# Patient Record
Sex: Male | Born: 1950
Health system: Southern US, Community
[De-identification: ages and names within clinical notes are randomized; demographics above are authoritative.]

## PROBLEM LIST (undated history)

## (undated) DIAGNOSIS — Z96642 Presence of left artificial hip joint: Secondary | ICD-10-CM

## (undated) DIAGNOSIS — T84061A Wear of articular bearing surface of internal prosthetic left hip joint, initial encounter: Secondary | ICD-10-CM

## (undated) DIAGNOSIS — L639 Alopecia areata, unspecified: Secondary | ICD-10-CM

## (undated) DIAGNOSIS — Z8711 Personal history of peptic ulcer disease: Secondary | ICD-10-CM

## (undated) DIAGNOSIS — E119 Type 2 diabetes mellitus without complications: Secondary | ICD-10-CM

## (undated) DIAGNOSIS — M160 Bilateral primary osteoarthritis of hip: Secondary | ICD-10-CM

## (undated) DIAGNOSIS — T4145XA Adverse effect of unspecified anesthetic, initial encounter: Secondary | ICD-10-CM

## (undated) DIAGNOSIS — J189 Pneumonia, unspecified organism: Secondary | ICD-10-CM

## (undated) DIAGNOSIS — T8859XA Other complications of anesthesia, initial encounter: Secondary | ICD-10-CM

## (undated) DIAGNOSIS — E7800 Pure hypercholesterolemia, unspecified: Secondary | ICD-10-CM

## (undated) DIAGNOSIS — K219 Gastro-esophageal reflux disease without esophagitis: Secondary | ICD-10-CM

## (undated) DIAGNOSIS — R490 Dysphonia: Secondary | ICD-10-CM

## (undated) DIAGNOSIS — K2270 Barrett's esophagus without dysplasia: Secondary | ICD-10-CM

## (undated) DIAGNOSIS — M1990 Unspecified osteoarthritis, unspecified site: Secondary | ICD-10-CM

## (undated) DIAGNOSIS — Z87442 Personal history of urinary calculi: Secondary | ICD-10-CM

## (undated) DIAGNOSIS — Z96641 Presence of right artificial hip joint: Secondary | ICD-10-CM

## (undated) DIAGNOSIS — Z8719 Personal history of other diseases of the digestive system: Secondary | ICD-10-CM

## (undated) DIAGNOSIS — K759 Inflammatory liver disease, unspecified: Secondary | ICD-10-CM

## (undated) HISTORY — PX: HEMIARTHROPLASTY (BIPOLAR), HIP, DIRECT ANTERIOR APPROACH, FOR FRACTURE: SHX7584

## (undated) HISTORY — PX: SHOULDER SURGERY: SHX246

## (undated) HISTORY — PX: BACK SURGERY: SHX140

## (undated) HISTORY — PX: HERNIA REPAIR: SHX51

## (undated) HISTORY — DX: Type 2 diabetes mellitus without complications: E11.9

---
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
## 1981-08-28 HISTORY — PX: SPLENECTOMY: SUR1306

## 2001-08-28 HISTORY — PX: JOINT REPLACEMENT: SHX530

## 2001-08-28 HISTORY — PX: ARTHROPLASTY: SHX135

## 2005-03-02 ENCOUNTER — Ambulatory Visit: Payer: Self-pay | Admitting: Internal Medicine

## 2005-10-21 ENCOUNTER — Emergency Department: Payer: Self-pay | Admitting: Emergency Medicine

## 2005-10-25 ENCOUNTER — Ambulatory Visit: Payer: Self-pay | Admitting: Pain Medicine

## 2005-11-02 ENCOUNTER — Ambulatory Visit: Payer: Self-pay | Admitting: Pain Medicine

## 2005-11-20 ENCOUNTER — Ambulatory Visit: Payer: Self-pay | Admitting: Pain Medicine

## 2006-02-27 ENCOUNTER — Ambulatory Visit: Payer: Self-pay | Admitting: Cardiovascular Disease

## 2006-03-03 ENCOUNTER — Emergency Department: Payer: Self-pay | Admitting: Emergency Medicine

## 2008-01-16 ENCOUNTER — Emergency Department: Payer: Self-pay | Admitting: Emergency Medicine

## 2008-04-09 ENCOUNTER — Other Ambulatory Visit: Payer: Self-pay

## 2008-04-10 ENCOUNTER — Inpatient Hospital Stay: Payer: Self-pay | Admitting: Internal Medicine

## 2008-06-30 ENCOUNTER — Ambulatory Visit: Payer: Self-pay | Admitting: Unknown Physician Specialty

## 2008-10-27 ENCOUNTER — Ambulatory Visit: Payer: Self-pay

## 2008-11-11 ENCOUNTER — Ambulatory Visit: Payer: Self-pay | Admitting: Pain Medicine

## 2008-11-17 ENCOUNTER — Ambulatory Visit: Payer: Self-pay | Admitting: Pain Medicine

## 2008-12-01 ENCOUNTER — Ambulatory Visit: Payer: Self-pay | Admitting: Physician Assistant

## 2008-12-15 ENCOUNTER — Ambulatory Visit: Payer: Self-pay | Admitting: Pain Medicine

## 2008-12-29 ENCOUNTER — Ambulatory Visit: Payer: Self-pay | Admitting: Pain Medicine

## 2009-01-12 ENCOUNTER — Ambulatory Visit: Payer: Self-pay | Admitting: Physician Assistant

## 2009-04-14 ENCOUNTER — Ambulatory Visit: Payer: Self-pay

## 2009-05-25 ENCOUNTER — Ambulatory Visit: Payer: Self-pay | Admitting: Unknown Physician Specialty

## 2009-06-01 ENCOUNTER — Inpatient Hospital Stay: Payer: Self-pay | Admitting: Unknown Physician Specialty

## 2009-06-23 ENCOUNTER — Ambulatory Visit: Payer: Self-pay | Admitting: Unknown Physician Specialty

## 2009-06-29 ENCOUNTER — Ambulatory Visit: Payer: Self-pay | Admitting: Unknown Physician Specialty

## 2010-01-13 ENCOUNTER — Observation Stay (HOSPITAL_COMMUNITY): Admission: EM | Admit: 2010-01-13 | Discharge: 2010-01-14 | Payer: Self-pay | Admitting: Emergency Medicine

## 2010-02-03 ENCOUNTER — Ambulatory Visit: Payer: Self-pay | Admitting: Unknown Physician Specialty

## 2010-02-10 ENCOUNTER — Inpatient Hospital Stay: Payer: Self-pay | Admitting: Unknown Physician Specialty

## 2010-06-21 ENCOUNTER — Ambulatory Visit: Payer: Self-pay

## 2013-01-15 ENCOUNTER — Ambulatory Visit: Payer: Self-pay | Admitting: Family Medicine

## 2015-03-24 DIAGNOSIS — E785 Hyperlipidemia, unspecified: Secondary | ICD-10-CM | POA: Insufficient documentation

## 2015-03-24 DIAGNOSIS — L639 Alopecia areata, unspecified: Secondary | ICD-10-CM | POA: Insufficient documentation

## 2015-03-24 DIAGNOSIS — Z8711 Personal history of peptic ulcer disease: Secondary | ICD-10-CM | POA: Insufficient documentation

## 2015-03-24 DIAGNOSIS — M1990 Unspecified osteoarthritis, unspecified site: Secondary | ICD-10-CM | POA: Insufficient documentation

## 2015-03-24 DIAGNOSIS — E7800 Pure hypercholesterolemia, unspecified: Secondary | ICD-10-CM | POA: Insufficient documentation

## 2015-03-24 DIAGNOSIS — K30 Functional dyspepsia: Secondary | ICD-10-CM | POA: Insufficient documentation

## 2015-03-26 ENCOUNTER — Ambulatory Visit (INDEPENDENT_AMBULATORY_CARE_PROVIDER_SITE_OTHER): Payer: BLUE CROSS/BLUE SHIELD | Admitting: Family Medicine

## 2015-03-26 ENCOUNTER — Encounter: Payer: Self-pay | Admitting: Family Medicine

## 2015-03-26 VITALS — BP 102/78 | HR 68 | Temp 97.9°F | Resp 16 | Wt 225.8 lb

## 2015-03-26 DIAGNOSIS — R10814 Left lower quadrant abdominal tenderness: Secondary | ICD-10-CM | POA: Diagnosis not present

## 2015-03-26 DIAGNOSIS — E7800 Pure hypercholesterolemia, unspecified: Secondary | ICD-10-CM

## 2015-03-26 LAB — POCT URINALYSIS DIPSTICK
Bilirubin, UA: NEGATIVE
Glucose, UA: NEGATIVE
Ketones, UA: NEGATIVE
LEUKOCYTES UA: NEGATIVE
NITRITE UA: NEGATIVE
PROTEIN UA: NEGATIVE
RBC UA: NEGATIVE
Spec Grav, UA: 1.015
UROBILINOGEN UA: 0.2
pH, UA: 5

## 2015-03-26 NOTE — Progress Notes (Signed)
Patient ID: DELEON PASSE, male   DOB: [birth year], 64 y.o.   MRN: [id number]    Patient: Kevin Mccullough Male    DOB: 17-May-1951   64 y.o.   MRN: [id number] Visit Date: 03/26/2015  Today's Provider: Vernie Murders, PA   Chief Complaint  Patient presents with  . Abdominal Pain    episodic X 2 months   Subjective:    HPI  This 64 year old male developed episodes of LLQ abdominal pain without fever, bloody stools, constipation or diarrhea. No dysuria or hematuria. Feeling well today without discomfort. Scheduled a CPE next week. Has not taken any medications recently. History reviewed. No pertinent past medical history. Patient Active Problem List   Diagnosis Date Noted  . AA (alopecia areata) 03/24/2015  . Acid indigestion 03/24/2015  . H/O peptic ulcer 03/24/2015  . HLD (hyperlipidemia) 03/24/2015  . Arthritis, degenerative 03/24/2015  . Hypercholesterolemia without hypertriglyceridemia 03/24/2015   Family History  Problem Relation Age of Onset  . Liver disease Mother   . Arthritis Sister   . Diabetes Sister   . Multiple sclerosis Daughter   . Arthritis Sister   . Arthritis Sister    No Known Allergies    Previous Medications   CHOLECALCIFEROL (VITAMIN D3) 2000 UNITS TABS    Take 1 tablet by mouth daily.   MULTIPLE VITAMINS-MINERALS PO    Take 1 tablet by mouth daily.   OMEGA-3 FATTY ACIDS PO    Take 1 tablet by mouth daily.    Review of Systems  Constitutional: Negative.   HENT: Negative.   Respiratory: Negative.   Cardiovascular: Negative.   Gastrointestinal: Positive for abdominal pain and abdominal distention.       No pain or distention today.  Genitourinary: Negative.   All other systems reviewed and are negative.  History  Substance Use Topics  . Smoking status: Former Smoker -- 1.00 packs/day for 17 years    Types: Cigarettes  . Smokeless tobacco: Never Used     Comment: QUIT IN 1989  . Alcohol Use: No   Objective:   BP 102/78 mmHg  Pulse 68   Temp(Src) 97.9 F (36.6 C) (Oral)  Resp 16  Wt 225 lb 12.8 oz (102.422 kg)  Physical Exam  Constitutional: He is oriented to person, place, and time. He appears well-developed and well-nourished.  HENT:  Head: Normocephalic and atraumatic.  Eyes: Conjunctivae and EOM are normal.  Cardiovascular: Normal rate and regular rhythm.   Pulmonary/Chest: Effort normal and breath sounds normal.  Abdominal: Soft. Bowel sounds are normal. He exhibits no mass.  Genitourinary: Penis normal.  Neurological: He is alert and oriented to person, place, and time.      Assessment & Plan:   1. LLQ abdominal tenderness Resolved today. Recommend bland diet with increased liquids - low residue. May have been an episode of mild diverticulitis. States he has scheduled a CPE here next Friday and had received a letter from Dr. Vira Agar regarding a need for a colonoscopy. Denies blood in stools, constipation or diarrhea. Will check labs and follow up pending reports. - Comprehensive metabolic panel - CBC with Differential/Platelet - PSA - POCT Urinalysis Dipstick  2. Hypercholesterolemia without hypertriglyceridemia Trying to lose weight. Does not take any medications or other supplements for cholesterol. Encouraged to follow a low fat diet and will recheck labs. - Comprehensive metabolic panel - Lipid panel - TSH

## 2015-03-30 ENCOUNTER — Telehealth: Payer: Self-pay

## 2015-03-30 LAB — CBC WITH DIFFERENTIAL/PLATELET
Basophils Absolute: 0 10*3/uL (ref 0.0–0.2)
Basos: 1 %
EOS (ABSOLUTE): 0.3 10*3/uL (ref 0.0–0.4)
EOS: 4 %
HEMATOCRIT: 41.7 % (ref 37.5–51.0)
Hemoglobin: 14.4 g/dL (ref 12.6–17.7)
Immature Grans (Abs): 0 10*3/uL (ref 0.0–0.1)
Immature Granulocytes: 0 %
LYMPHS ABS: 2.7 10*3/uL (ref 0.7–3.1)
Lymphs: 33 %
MCH: 32.1 pg (ref 26.6–33.0)
MCHC: 34.5 g/dL (ref 31.5–35.7)
MCV: 93 fL (ref 79–97)
MONOCYTES: 7 %
MONOS ABS: 0.6 10*3/uL (ref 0.1–0.9)
NEUTROS ABS: 4.6 10*3/uL (ref 1.4–7.0)
NEUTROS PCT: 55 %
Platelets: 294 10*3/uL (ref 150–379)
RBC: 4.49 x10E6/uL (ref 4.14–5.80)
RDW: 14.2 % (ref 12.3–15.4)
WBC: 8.2 10*3/uL (ref 3.4–10.8)

## 2015-03-30 LAB — TSH: TSH: 3.18 u[IU]/mL (ref 0.450–4.500)

## 2015-03-30 LAB — COMPREHENSIVE METABOLIC PANEL
ALBUMIN: 4.6 g/dL (ref 3.6–4.8)
ALK PHOS: 62 IU/L (ref 39–117)
ALT: 25 IU/L (ref 0–44)
AST: 21 IU/L (ref 0–40)
Albumin/Globulin Ratio: 1.9 (ref 1.1–2.5)
BUN / CREAT RATIO: 9 — AB (ref 10–22)
BUN: 10 mg/dL (ref 8–27)
Bilirubin Total: 1.3 mg/dL — ABNORMAL HIGH (ref 0.0–1.2)
CO2: 23 mmol/L (ref 18–29)
Calcium: 9.9 mg/dL (ref 8.6–10.2)
Chloride: 102 mmol/L (ref 97–108)
Creatinine, Ser: 1.13 mg/dL (ref 0.76–1.27)
GFR calc Af Amer: 79 mL/min/{1.73_m2} (ref >59)
GFR, EST NON AFRICAN AMERICAN: 68 mL/min/{1.73_m2} (ref >59)
GLOBULIN, TOTAL: 2.4 g/dL (ref 1.5–4.5)
Glucose: 98 mg/dL (ref 65–99)
Potassium: 4.9 mmol/L (ref 3.5–5.2)
Sodium: 143 mmol/L (ref 134–144)
TOTAL PROTEIN: 7 g/dL (ref 6.0–8.5)

## 2015-03-30 LAB — LIPID PANEL
Chol/HDL Ratio: 4.3 ratio units (ref 0.0–5.0)
Cholesterol, Total: 225 mg/dL — ABNORMAL HIGH (ref 100–199)
HDL: 52 mg/dL (ref >39)
LDL CALC: 149 mg/dL — AB (ref 0–99)
TRIGLYCERIDES: 122 mg/dL (ref 0–149)
VLDL CHOLESTEROL CAL: 24 mg/dL (ref 5–40)

## 2015-03-30 LAB — PSA: Prostate Specific Ag, Serum: 0.8 ng/mL (ref 0.0–4.0)

## 2015-03-30 NOTE — Telephone Encounter (Signed)
LMTCB

## 2015-03-30 NOTE — Telephone Encounter (Signed)
-----   Message from Margo Common, [place] sent at 03/30/2015  8:20 AM EDT ----- All blood tests essentially normal except LDL cholesterol and total cholesterol high. Recommend adding Metamucil 1 teaspoon in 4-6 ounces of juice or water BID to low fat diet, exercise and weight loss efforts. Recheck response in 3 months. Proceed with CPE as planned.

## 2015-03-31 NOTE — Telephone Encounter (Signed)
Patient advised as directed below. Patient verbalized understanding and agrees with treatment plan. 

## 2015-04-02 ENCOUNTER — Ambulatory Visit (INDEPENDENT_AMBULATORY_CARE_PROVIDER_SITE_OTHER): Payer: BLUE CROSS/BLUE SHIELD | Admitting: Family Medicine

## 2015-04-02 ENCOUNTER — Encounter: Payer: Self-pay | Admitting: Family Medicine

## 2015-04-02 VITALS — BP 116/72 | HR 74 | Temp 97.8°F | Resp 16 | Ht 69.5 in | Wt 222.8 lb

## 2015-04-02 DIAGNOSIS — M150 Primary generalized (osteo)arthritis: Secondary | ICD-10-CM

## 2015-04-02 DIAGNOSIS — M8949 Other hypertrophic osteoarthropathy, multiple sites: Secondary | ICD-10-CM

## 2015-04-02 DIAGNOSIS — Z0000 Encounter for general adult medical examination without abnormal findings: Secondary | ICD-10-CM

## 2015-04-02 DIAGNOSIS — E785 Hyperlipidemia, unspecified: Secondary | ICD-10-CM | POA: Diagnosis not present

## 2015-04-02 DIAGNOSIS — Z23 Encounter for immunization: Secondary | ICD-10-CM

## 2015-04-02 DIAGNOSIS — M159 Polyosteoarthritis, unspecified: Secondary | ICD-10-CM

## 2015-04-02 LAB — POCT URINALYSIS DIPSTICK
Bilirubin, UA: NEGATIVE
GLUCOSE UA: NEGATIVE
Ketones, UA: NEGATIVE
Leukocytes, UA: NEGATIVE
NITRITE UA: NEGATIVE
PH UA: 6
Protein, UA: NEGATIVE
RBC UA: NEGATIVE
Urobilinogen, UA: 0.2

## 2015-04-02 NOTE — Progress Notes (Signed)
Patient ID: Kevin Mccullough, male   DOB: 16-Feb-1951, 64 y.o.   MRN: [id number] Patient: Kevin Mccullough, Male    DOB: September 17, 1950, 64 y.o.   MRN: [id number] Visit Date: 04/02/2015  Today's Provider: Vernie Murders, PA   Chief Complaint  Patient presents with  . Annual Exam   Subjective:  Kevin Mccullough is a 64 y.o. male who presents today for health maintenance and complete physical. He feels fairly well. He reports exercising while working third shift and the great deal of physical work on the job, limits exercise. He reports he is sleeping poorly getting very interrupted 6 hours a day and associated with 3rd shift work (go to bed around 10:00 am and get up around 8:30 pm).   Review of Systems  Constitutional: Positive for fatigue. Negative for fever.  HENT: Negative.   Eyes: Negative.   Respiratory: Negative.   Cardiovascular: Negative.   Gastrointestinal: Negative.  Negative for abdominal pain, constipation and blood in stool.  Genitourinary: Negative.   Musculoskeletal: Positive for back pain and neck stiffness.       Stiff and achy in low back and neck only while working. Stops when he gets home and sleeps some. History 4 lumbar back surgeries for HNP's and fusions.  Neurological: Positive for numbness. Negative for dizziness, light-headedness and headaches.       Numbness left lateral foot from past back surgeries.  Psychiatric/Behavioral: Positive for sleep disturbance.   History   Social History  . Marital Status: Married    Spouse Name: N/A  . Number of Children: N/A  . Years of Education: N/A   Occupational History  . Not on file.   Social History Main Topics  . Smoking status: Former Smoker -- 1.00 packs/day for 17 years    Types: Cigarettes  . Smokeless tobacco: Never Used     Comment: QUIT IN 1989  . Alcohol Use: No  . Drug Use: No  . Sexual Activity: Not on file   Other Topics Concern  . Not on file   Social History Narrative   Patient Active Problem  List   Diagnosis Date Noted  . AA (alopecia areata) 03/24/2015  . Acid indigestion 03/24/2015  . H/O peptic ulcer 03/24/2015  . HLD (hyperlipidemia) 03/24/2015  . Arthritis, degenerative 03/24/2015  . Hypercholesterolemia without hypertriglyceridemia 03/24/2015   Past Surgical History  Procedure Laterality Date  . Splenectomy  1983  . Tonsillectomy  1954  . Arthroplasty Left 2003  . Back surgery  1983,1991,2010,2011   His family history includes Arthritis in his sister, sister, and sister; Diabetes in his sister; Liver disease in his mother; Multiple sclerosis in his daughter.    Outpatient Prescriptions Prior to Visit  Medication Sig Dispense Refill  . Cholecalciferol (VITAMIN D3) 2000 UNITS TABS Take 1 tablet by mouth daily.    . MULTIPLE VITAMINS-MINERALS PO Take 1 tablet by mouth daily.    . OMEGA-3 FATTY ACIDS PO Take 1 tablet by mouth daily.     No facility-administered medications prior to visit.   No Known Allergies  Patient Care Team: Margo Common, PA as PCP - General (Physician Assistant)    Objective:   Vitals:  Filed Vitals:   04/02/15 1002  BP: 116/72  Pulse: 74  Temp: 97.8 F (36.6 C)  TempSrc: Oral  Resp: 16  Height: 5' 9.5" (1.765 m)  Weight: 222 lb 12.8 oz (101.061 kg)  SpO2: 95%  Body mass index is 32.44 kg/(m^2). Wt Readings from  Last 3 Encounters:  04/02/15 222 lb 12.8 oz (101.061 kg)  03/26/15 225 lb 12.8 oz (102.422 kg)  02/05/14 260 lb (117.935 kg)   Physical Exam  Constitutional: He is oriented to person, place, and time. He appears well-developed and well-nourished.  HENT:  Head: Normocephalic and atraumatic.  Right Ear: External ear normal.  Left Ear: External ear normal.  Nose: Nose normal.  Mouth/Throat: Oropharynx is clear and moist.  Eyes: Conjunctivae and EOM are normal. Pupils are equal, round, and reactive to light.  Neck:  Fair ROM with slight stiffness. No thyromegaly or nodules. No JVD or lymphadenopathy.   Cardiovascular: Normal rate, regular rhythm, normal heart sounds and intact distal pulses.   Pulmonary/Chest: Breath sounds normal.  Abdominal: Soft. Bowel sounds are normal.  Genitourinary: Rectum normal, prostate normal and penis normal. Guaiac negative stool.  Musculoskeletal:  Well healed scars from multiple lumbar surgeries/fusions and left hip replacement. Stiffness and limited ROM in spine and left hip. No significant pain today.  Neurological: He is alert and oriented to person, place, and time.  Numbness along the lateral side of the left foot as a residual of back surgeries.  Skin: Skin is warm and dry. No rash noted.  Psychiatric: He has a normal mood and affect. His behavior is normal. Judgment and thought content normal.    Depression Screen PHQ 2/9 Scores 04/02/2015  PHQ - 2 Score 1   Assessment & Plan:     Routine Health Maintenance and Physical Exam  Exercise Activities and Dietary recommendations Goals    As tolerated due to limitations from past back HNP and lumbar fusion surgeries with arthritis in the left shoulder and left hip replacement.      Immunization History  Administered Date(s) Administered  . Tdap 06/21/2010    Health Maintenance  Topic Date Due  . HIV Screening  06/10/1965  . COLONOSCOPY  06/10/2000  . ZOSTAVAX  06/10/2010  . INFLUENZA VACCINE  03/29/2015  . TETANUS/TDAP  06/21/2020      Discussed health benefits of physical activity, and encouraged him to engage in regular exercise appropriate for his age and condition.    ------------------------------------------------------------------------------------------------------------  1. Annual physical exam General health stable and EKG wnl with sinus bradycardia and no acute changes.  - POCT urinalysis dipstick - EKG 12-Lead Recent Results (from the past 2160 hour(s))  POCT Urinalysis Dipstick     Status: None   Collection Time: 03/26/15  4:44 PM  Result Value Ref Range   Color, UA  dark yellow    Clarity, UA clear    Glucose, UA neg    Bilirubin, UA neg    Ketones, UA neg    Spec Grav, UA 1.015    Blood, UA neg    pH, UA 5.0    Protein, UA neg    Urobilinogen, UA 0.2    Nitrite, UA neg    Leukocytes, UA Negative Negative  Comprehensive metabolic panel     Status: Abnormal   Collection Time: 03/29/15  8:10 AM  Result Value Ref Range   Glucose 98 65 - 99 mg/dL   BUN 10 8 - 27 mg/dL   Creatinine, Ser 1.13 0.76 - 1.27 mg/dL   GFR calc non Af Amer 68 >59 mL/min/1.73   GFR calc Af Amer 79 >59 mL/min/1.73   BUN/Creatinine Ratio 9 (L) 10 - 22   Sodium 143 134 - 144 mmol/L   Potassium 4.9 3.5 - 5.2 mmol/L   Chloride 102 97 - 108  mmol/L   CO2 23 18 - 29 mmol/L   Calcium 9.9 8.6 - 10.2 mg/dL   Total Protein 7.0 6.0 - 8.5 g/dL   Albumin 4.6 3.6 - 4.8 g/dL   Globulin, Total 2.4 1.5 - 4.5 g/dL   Albumin/Globulin Ratio 1.9 1.1 - 2.5   Bilirubin Total 1.3 (H) 0.0 - 1.2 mg/dL   Alkaline Phosphatase 62 39 - 117 IU/L   AST 21 0 - 40 IU/L   ALT 25 0 - 44 IU/L  CBC with Differential/Platelet     Status: None   Collection Time: 03/29/15  8:10 AM  Result Value Ref Range   WBC 8.2 3.4 - 10.8 x10E3/uL   RBC 4.49 4.14 - 5.80 x10E6/uL   Hemoglobin 14.4 12.6 - 17.7 g/dL   Hematocrit 41.7 37.5 - 51.0 %   MCV 93 79 - 97 fL   MCH 32.1 26.6 - 33.0 pg   MCHC 34.5 31.5 - 35.7 g/dL   RDW 14.2 12.3 - 15.4 %   Platelets 294 150 - 379 x10E3/uL   Neutrophils 55 %   Lymphs 33 %   Monocytes 7 %   Eos 4 %   Basos 1 %   Neutrophils Absolute 4.6 1.4 - 7.0 x10E3/uL   Lymphocytes Absolute 2.7 0.7 - 3.1 x10E3/uL   Monocytes Absolute 0.6 0.1 - 0.9 x10E3/uL   EOS (ABSOLUTE) 0.3 0.0 - 0.4 x10E3/uL   Basophils Absolute 0.0 0.0 - 0.2 x10E3/uL   Immature Granulocytes 0 %   Immature Grans (Abs) 0.0 0.0 - 0.1 x10E3/uL  PSA     Status: None   Collection Time: 03/29/15  8:10 AM  Result Value Ref Range   Prostate Specific Ag, Serum 0.8 0.0 - 4.0 ng/mL    Comment: Roche ECLIA  methodology. According to the American Urological Association, Serum PSA should decrease and remain at undetectable levels after radical prostatectomy. The AUA defines biochemical recurrence as an initial PSA value 0.2 ng/mL or greater followed by a subsequent confirmatory PSA value 0.2 ng/mL or greater. Values obtained with different assay methods or kits cannot be used interchangeably. Results cannot be interpreted as absolute evidence of the presence or absence of malignant disease.   Lipid panel     Status: Abnormal   Collection Time: 03/29/15  8:10 AM  Result Value Ref Range   Cholesterol, Total 225 (H) 100 - 199 mg/dL   Triglycerides 122 0 - 149 mg/dL   HDL 52 >39 mg/dL    Comment: According to ATP-III Guidelines, HDL-C >59 mg/dL is considered a negative risk factor for CHD.    VLDL Cholesterol Cal 24 5 - 40 mg/dL   LDL Calculated 149 (H) 0 - 99 mg/dL   Chol/HDL Ratio 4.3 0.0 - 5.0 ratio units    Comment:                                   T. Chol/HDL Ratio                                             Men  Women                               1/2 Avg.Risk  3.4    3.3  Avg.Risk  5.0    4.4                                2X Avg.Risk  9.6    7.1                                3X Avg.Risk 23.4   11.0   TSH     Status: None   Collection Time: 03/29/15  8:10 AM  Result Value Ref Range   TSH 3.180 0.450 - 4.500 uIU/mL  POCT urinalysis dipstick     Status: None   Collection Time: 04/02/15 10:13 AM  Result Value Ref Range   Color, UA yellow    Clarity, UA clear    Glucose, UA neg    Bilirubin, UA neg    Ketones, UA neg    Spec Grav, UA <=1.005    Blood, UA neg    pH, UA 6.0    Protein, UA neg    Urobilinogen, UA 0.2    Nitrite, UA neg    Leukocytes, UA Negative Negative    2. Primary osteoarthritis involving multiple joints Stable with some limitations in ROM of back, left shoulder and left hip from past surgeries.   3. HLD  (hyperlipidemia) Some elevation of LDL and total cholesterol. Continue low fat diet and Omega-3 Fatty acid daily. EKG normal today. - EKG 12-Lead  4. Need for vaccination with 13-polyvalent pneumococcal conjugate vaccine Due to Prevnar. Will consider pneumovax next year. Last shingles vaccination was 07-03-13 and Tdap 06-21-10. Recheck annually. - Pneumococcal conjugate vaccine 13-valent

## 2015-05-24 ENCOUNTER — Encounter
Admission: RE | Admit: 2015-05-24 | Discharge: 2015-05-24 | Disposition: A | Discharge status: Home / Self Care | Payer: BLUE CROSS/BLUE SHIELD | Source: Ambulatory Visit | Attending: Surgery | Admitting: Surgery

## 2015-05-24 DIAGNOSIS — Z01818 Encounter for other preprocedural examination: Secondary | ICD-10-CM | POA: Diagnosis present

## 2015-05-24 DIAGNOSIS — K4090 Unilateral inguinal hernia, without obstruction or gangrene, not specified as recurrent: Secondary | ICD-10-CM | POA: Diagnosis not present

## 2015-05-24 HISTORY — DX: Unspecified osteoarthritis, unspecified site: M19.90

## 2015-05-24 HISTORY — DX: Personal history of peptic ulcer disease: Z87.11

## 2015-05-24 NOTE — Patient Instructions (Signed)
  Your procedure is scheduled on: June 01, 2015 (Tuesday) Report to Day Surgery. [facility]) To find out your arrival time please call [phone] between 1PM - 3PM on October 3, (Monday)  Remember: Instructions that are not followed completely may result in serious medical risk, up to and including death, or upon the discretion of your surgeon and anesthesiologist your surgery may need to be rescheduled.    __x__ 1. Do not eat food or drink liquids after midnight. No gum chewing or hard candies.     ____ 2. No Alcohol for 24 hours before or after surgery.   ____ 3. Bring all medications with you on the day of surgery if instructed.    __x__ 4. Notify your doctor if there is any change in your medical condition     (cold, fever, infections).     Do not wear jewelry, make-up, hairpins, clips or nail polish.  Do not wear lotions, powders, or perfumes. You may wear deodorant.  Do not shave 48 hours prior to surgery. Men may shave face and neck.  Do not bring valuables to the hospital.    [facility] is not responsible for any belongings or valuables.               Contacts, dentures or bridgework may not be worn into surgery.  Leave your suitcase in the car. After surgery it may be brought to your room.  For patients admitted to the hospital, discharge time is determined by your                treatment team.   Patients discharged the day of surgery will not be allowed to drive home.   Please read over the following fact sheets that you were given:   Surgical Site Infection Prevention   ____ Take these medicines the morning of surgery with A SIP OF WATER:    1.   2.   3.   4.  5.  6.  ____ Fleet Enema (as directed)   __x__ Use CHG Soap as directed  ____ Use inhalers on the day of surgery  ____ Stop metformin 2 days prior to surgery    ____ Take 1/2 of usual insulin dose the night before surgery and none on the morning of surgery.   ____ Stop  Coumadin/Plavix/aspirin on   ____ Stop Anti-inflammatories on    __x__ Stop supplements until after surgery.  (STOP FISH OIL NOW)  ____ Bring C-Pap to the hospital.

## 2015-06-01 ENCOUNTER — Encounter
Admission: RE | Disposition: A | Discharge status: Home / Self Care | Payer: Self-pay | Source: Ambulatory Visit | Attending: Surgery

## 2015-06-01 ENCOUNTER — Ambulatory Visit: Payer: Medicare HMO | Admitting: Anesthesiology

## 2015-06-01 ENCOUNTER — Ambulatory Visit
Admission: RE | Admit: 2015-06-01 | Discharge: 2015-06-01 | Disposition: A | Discharge status: Home / Self Care | Payer: Medicare HMO | Source: Ambulatory Visit | Attending: Surgery | Admitting: Surgery

## 2015-06-01 DIAGNOSIS — Z820 Family history of epilepsy and other diseases of the nervous system: Secondary | ICD-10-CM | POA: Insufficient documentation

## 2015-06-01 DIAGNOSIS — Z79899 Other long term (current) drug therapy: Secondary | ICD-10-CM | POA: Diagnosis not present

## 2015-06-01 DIAGNOSIS — Z96642 Presence of left artificial hip joint: Secondary | ICD-10-CM | POA: Diagnosis not present

## 2015-06-01 DIAGNOSIS — Z8379 Family history of other diseases of the digestive system: Secondary | ICD-10-CM | POA: Diagnosis not present

## 2015-06-01 DIAGNOSIS — Z8711 Personal history of peptic ulcer disease: Secondary | ICD-10-CM | POA: Insufficient documentation

## 2015-06-01 DIAGNOSIS — Z833 Family history of diabetes mellitus: Secondary | ICD-10-CM | POA: Insufficient documentation

## 2015-06-01 DIAGNOSIS — Z9889 Other specified postprocedural states: Secondary | ICD-10-CM | POA: Insufficient documentation

## 2015-06-01 DIAGNOSIS — Z87891 Personal history of nicotine dependence: Secondary | ICD-10-CM | POA: Insufficient documentation

## 2015-06-01 DIAGNOSIS — Z8261 Family history of arthritis: Secondary | ICD-10-CM | POA: Diagnosis not present

## 2015-06-01 DIAGNOSIS — M1990 Unspecified osteoarthritis, unspecified site: Secondary | ICD-10-CM | POA: Diagnosis not present

## 2015-06-01 DIAGNOSIS — K4090 Unilateral inguinal hernia, without obstruction or gangrene, not specified as recurrent: Secondary | ICD-10-CM | POA: Diagnosis not present

## 2015-06-01 HISTORY — PX: INGUINAL HERNIA REPAIR: SHX194

## 2015-06-01 SURGERY — REPAIR, HERNIA, INGUINAL, ADULT
Anesthesia: General | Laterality: Left

## 2015-06-01 MED ORDER — NEOSTIGMINE METHYLSULFATE 10 MG/10ML IV SOLN
INTRAVENOUS | Status: DC | PRN
  Administered 2015-06-01: 3 mg via INTRAVENOUS

## 2015-06-01 MED ORDER — HYDROCODONE-ACETAMINOPHEN 5-325 MG PO TABS
1.0000 | ORAL_TABLET | ORAL | Status: DC | PRN
  Administered 2015-06-01: 1 via ORAL

## 2015-06-01 MED ORDER — CEFAZOLIN SODIUM-DEXTROSE 2-3 GM-% IV SOLR
INTRAVENOUS | Status: AC
  Administered 2015-06-01: 2 g via INTRAVENOUS
  Filled 2015-06-01: qty 50

## 2015-06-01 MED ORDER — HYDROCODONE-ACETAMINOPHEN 5-325 MG PO TABS: 1.0000 | ORAL_TABLET | ORAL | Status: DC | PRN

## 2015-06-01 MED ORDER — FENTANYL CITRATE (PF) 100 MCG/2ML IJ SOLN
INTRAMUSCULAR | Status: DC | PRN
  Administered 2015-06-01 (×2): 50 ug via INTRAVENOUS

## 2015-06-01 MED ORDER — LIDOCAINE HCL (CARDIAC) 20 MG/ML IV SOLN
INTRAVENOUS | Status: DC | PRN
  Administered 2015-06-01: 30 mg via INTRAVENOUS

## 2015-06-01 MED ORDER — BUPIVACAINE-EPINEPHRINE (PF) 0.5% -1:200000 IJ SOLN
INTRAMUSCULAR | Status: AC
  Filled 2015-06-01: qty 30

## 2015-06-01 MED ORDER — ROCURONIUM BROMIDE 100 MG/10ML IV SOLN
INTRAVENOUS | Status: DC | PRN
  Administered 2015-06-01: 10 mg via INTRAVENOUS
  Administered 2015-06-01: 40 mg via INTRAVENOUS

## 2015-06-01 MED ORDER — HYDROCODONE-ACETAMINOPHEN 5-325 MG PO TABS
ORAL_TABLET | ORAL | Status: DC
Start: 2015-06-01 — End: 2015-06-01
  Filled 2015-06-01: qty 1

## 2015-06-01 MED ORDER — BUPIVACAINE-EPINEPHRINE (PF) 0.5% -1:200000 IJ SOLN
INTRAMUSCULAR | Status: DC | PRN
  Administered 2015-06-01: 28 mL via PERINEURAL

## 2015-06-01 MED ORDER — MIDAZOLAM HCL 2 MG/2ML IJ SOLN
INTRAMUSCULAR | Status: DC | PRN
  Administered 2015-06-01: 2 mg via INTRAVENOUS

## 2015-06-01 MED ORDER — FENTANYL CITRATE (PF) 100 MCG/2ML IJ SOLN
25.0000 ug | INTRAMUSCULAR | Status: AC | PRN
Start: 2015-06-01 — End: 2015-06-01
  Administered 2015-06-01 (×6): 25 ug via INTRAVENOUS

## 2015-06-01 MED ORDER — LACTATED RINGERS IV SOLN
INTRAVENOUS | Status: DC
  Administered 2015-06-01 (×2): via INTRAVENOUS

## 2015-06-01 MED ORDER — HYDROMORPHONE HCL 1 MG/ML IJ SOLN
0.2500 mg | INTRAMUSCULAR | Status: DC | PRN
  Administered 2015-06-01: 0.5 mg via INTRAVENOUS

## 2015-06-01 MED ORDER — CEFAZOLIN SODIUM-DEXTROSE 2-3 GM-% IV SOLR
2.0000 g | Freq: Once | INTRAVENOUS | Status: AC
  Administered 2015-06-01 (×2): 2 g via INTRAVENOUS

## 2015-06-01 MED ORDER — GLYCOPYRROLATE 0.2 MG/ML IJ SOLN
INTRAMUSCULAR | Status: DC | PRN
  Administered 2015-06-01: 0.6 mg via INTRAVENOUS

## 2015-06-01 MED ORDER — ONDANSETRON HCL 4 MG/2ML IJ SOLN: 4.0000 mg | Freq: Once | INTRAMUSCULAR | Status: DC | PRN

## 2015-06-01 MED ORDER — FAMOTIDINE 20 MG PO TABS
20.0000 mg | ORAL_TABLET | Freq: Once | ORAL | Status: AC
  Administered 2015-06-01: 20 mg via ORAL

## 2015-06-01 MED ORDER — FENTANYL CITRATE (PF) 100 MCG/2ML IJ SOLN
INTRAMUSCULAR | Status: AC
  Administered 2015-06-01: 25 ug via INTRAVENOUS
  Filled 2015-06-01: qty 2

## 2015-06-01 MED ORDER — FAMOTIDINE 20 MG PO TABS
ORAL_TABLET | ORAL | Status: AC
  Administered 2015-06-01: 20 mg via ORAL
  Filled 2015-06-01: qty 1

## 2015-06-01 MED ORDER — ONDANSETRON HCL 4 MG/2ML IJ SOLN
INTRAMUSCULAR | Status: DC | PRN
  Administered 2015-06-01: 4 mg via INTRAVENOUS

## 2015-06-01 MED ORDER — HYDROMORPHONE HCL 1 MG/ML IJ SOLN
INTRAMUSCULAR | Status: AC
  Administered 2015-06-01: 0.5 mg via INTRAVENOUS
  Filled 2015-06-01: qty 1

## 2015-06-01 MED ORDER — PROPOFOL 10 MG/ML IV BOLUS
INTRAVENOUS | Status: DC | PRN
  Administered 2015-06-01: 20 mg via INTRAVENOUS
  Administered 2015-06-01: 150 mg via INTRAVENOUS

## 2015-06-01 SURGICAL SUPPLY — 25 items
BLADE SURG 15 STRL LF DISP TIS (BLADE) ×1 IMPLANT
BLADE SURG 15 STRL SS (BLADE) ×2
CANISTER SUCT 1200ML W/VALVE (MISCELLANEOUS) ×2 IMPLANT
CHLORAPREP W/TINT 26ML (MISCELLANEOUS) ×2 IMPLANT
DRAIN PENROSE 5/8X18 LTX STRL (WOUND CARE) ×2 IMPLANT
DRAPE PED LAPAROTOMY (DRAPES) ×2 IMPLANT
GLOVE BIO SURGEON STRL SZ7.5 (GLOVE) ×5 IMPLANT
GOWN STRL REUS W/ TWL LRG LVL3 (GOWN DISPOSABLE) ×3 IMPLANT
GOWN STRL REUS W/TWL LRG LVL3 (GOWN DISPOSABLE) ×6
KIT RM TURNOVER STRD PROC AR (KITS) ×2 IMPLANT
LABEL OR SOLS (LABEL) ×2 IMPLANT
LIQUID BAND (GAUZE/BANDAGES/DRESSINGS) ×2 IMPLANT
MESH SYNTHETIC 4X6 SOFT BARD (Mesh General) ×1 IMPLANT
MESH SYNTHETIC SOFT BARD 4X6 (Mesh General) ×1 IMPLANT
NDL HYPO 25X1 1.5 SAFETY (NEEDLE) ×1 IMPLANT
NEEDLE HYPO 25X1 1.5 SAFETY (NEEDLE) ×2 IMPLANT
NS IRRIG 500ML POUR BTL (IV SOLUTION) ×2 IMPLANT
PACK BASIN MINOR ARMC (MISCELLANEOUS) ×2 IMPLANT
PAD GROUND ADULT SPLIT (MISCELLANEOUS) ×2 IMPLANT
SUT CHROMIC 4 0 RB 1X27 (SUTURE) ×1 IMPLANT
SUT MNCRL AB 4-0 PS2 18 (SUTURE) ×2 IMPLANT
SUT SURGILON 0 30 BLK (SUTURE) ×6 IMPLANT
SUT VIC AB 4-0 SH 27 (SUTURE) ×2
SUT VIC AB 4-0 SH 27XANBCTRL (SUTURE) ×2 IMPLANT
SYRINGE 10CC LL (SYRINGE) ×2 IMPLANT

## 2015-06-01 NOTE — H&P (Signed)
Kevin Mccullough is an 64 y.o. male.   Chief Complaint: Left inguinal hernia HPI:  He has had recent bulging in the left groin.  He was recently seen in the office and found to have a left inguinal hernia.  Hernia repairs recommended for definitive treatment.  Past Medical History  Diagnosis Date  . Arthritis   . History of bleeding ulcers     Past Surgical History  Procedure Laterality Date  . Splenectomy  1983  . Tonsillectomy  1954  . Arthroplasty Left 2003  . Back surgery  1983,1991,2010,2011    Spinal Fusion in 2011  . Joint replacement Left     Partial Hip Replacement  . Shoulder surgery Left     Family History  Problem Relation Age of Onset  . Liver disease Mother   . Arthritis Sister   . Diabetes Sister   . Multiple sclerosis Daughter   . Arthritis Sister   . Arthritis Sister    Social History:  reports that he quit smoking about 27 years ago. His smoking use included Cigarettes. He has a 17 pack-year smoking history. He has never used smokeless tobacco. He reports that he does not drink alcohol or use illicit drugs.  Allergies: No Known Allergies  Medications Prior to Admission  Medication Sig Dispense Refill  . Cholecalciferol (VITAMIN D3) 2000 UNITS TABS Take 1 tablet by mouth daily.    . MULTIPLE VITAMINS-MINERALS PO Take 1 tablet by mouth daily.    . OMEGA-3 FATTY ACIDS PO Take 1 tablet by mouth daily.      No results found for this or any previous visit (from the past 48 hour(s)). No results found.  ROS  He reports no other recent acute illness during the interval no cough cold or sore throat.  No difficulty breathing.  No chest pains.  Minimal discomfort at the hernia site.  Blood pressure 135/83, pulse 69, temperature 97.7 F (36.5 C), temperature source Tympanic, resp. rate 16, height 5\' 9"  (1.753 m), weight 98.884 kg (218 lb), SpO2 100 %. Physical Exam   GENERAL:  The patient is awake, alert and oriented, ambulatory and in no acute  distress.  HEENT:    Pupils equal, reactive to light, extraocular movements are intact, sclerae are clear, palpebral conjunctiva normal red color.  Pharynx clear.  LUNGS:   Patient is in no respiratory distress.  Lungs are clear without rales rhonchi or wheezes.   HEART:   Regular rhythm,  normal S1-S2 without murmur.  ABDOMEN:   Nondistended soft and nontender, with no palpable mass, no hepatomegaly.The hernia is currently reduced.  A   Yes mark was placed on the left side. Assessment/Plan  left inguinal hernia  I discussed the plan for left inguinal hernia repair  Rochel Brome 06/01/2015, 7:19 AM

## 2015-06-01 NOTE — Anesthesia Postprocedure Evaluation (Signed)
  Anesthesia Post-op Note  Patient: Kevin Mccullough  Procedure(s) Performed: Procedure(s): HERNIA REPAIR INGUINAL ADULT (Left)  Anesthesia type:General  Patient location: PACU  Post pain: Pain level controlled  Post assessment: Post-op Vital signs reviewed, Patient's Cardiovascular Status Stable, Respiratory Function Stable, Patent Airway and No signs of Nausea or vomiting  Post vital signs: Reviewed and stable  Last Vitals:  Filed Vitals:   06/01/15 1019  BP:   Pulse: 78  Temp: 37.1 C  Resp: 12    Level of consciousness: awake, alert  and patient cooperative  Complications: No apparent anesthesia complications

## 2015-06-01 NOTE — OR Nursing (Signed)
BP 114/57 , HR 70 . DENIES DIZZINESS OR NAUSEA , ASSISTED TO [place]

## 2015-06-01 NOTE — Op Note (Signed)
OPERATIVE REPORT  PREOPERATIVE DIAGNOSIS: left inguinal hernia  POSTOPERATIVE DIAGNOSIS:left  inguinal hernia  PROCEDURE:  left inguinal hernia repair  ANESTHESIA:  General  SURGEON:  Rochel Brome M.D.  INDICATIONS: He has had recent bulging in the left groin. A left inguinal hernia was demonstrated on physical exam. Repair is recommended for definitive treatment.  With the patient on the operating table in the supine position the left lower quadrant was prepared with clippers and with ChloraPrep and draped in a sterile manner. A transversely oriented suprapubic incision was made and carried down through subcutaneous tissues. Electrocautery was used for hemostasis. The Scarpa's fascia was incised. The external oblique aponeurosis was incised along the course of its fibers to open the external ring and expose the inguinal cord structures. The cord structures were mobilized. A Penrose drain was passed around the cord structures for traction. The cremaster muscle was incised exposing an indirect hernia sac. This sac was dissected free from surrounding structures and followed up into the internal ring. The sac was some 6 cm in length. The sac was opened. Its continuity with the peritoneal cavity was demonstrated. There was an epiploical appendage attached to the inner surface of the sac which was also attached to the sigmoid colon. A portion of the appendage was dissected away from the sac. This was a sliding type hernia. A portion of the sac was ligated with 0 Surgilon pursestring suture and amputated. This was not submitted for pathology. The remainder of the sac was inverted into the abdominal cavity. The repair was carried out with 0 Surgilon suturing the conjoined tendon to the shelving edge of the inguinal ligament incorporating transversalis fascia into the repair. The last stitch led to satisfactory narrowing of the internal ring.  Bard soft mesh was cut to create an oval shape and was placed over  the repair. This was sutured to the repair with interrupted 0 Surgilon sutures and also sutured medially to the deep fascia and on both sides of the internal ring. Next after seeing hemostasis was intact the cord structures were replaced along the floor of the inguinal canal. The cut edges of the external oblique aponeurosis were closed with a running 4-0 Vicryl suture to re-create the external ring. The deep fascia superior and lateral to the repair site was infiltrated with half percent Sensorcaine with epinephrine. Subcutaneous tissues were also infiltrated. The Scarpa's fascia was closed with interrupted 4-0 Vicryl sutures. The skin was closed with running 4-0 Monocryl subcuticular suture and LiquiBand. The testicle remained in the scrotum  The patient appeared to be in satisfactory condition and was prepared for transfer to the recovery room.  Rochel Brome M.D.

## 2015-06-01 NOTE — OR Nursing (Signed)
DR. Nicholes Stairs AT BEDSIDE FOR EVAL . PT. STABLE , AMBULATED FOR APPOX. 30 FEET , NO ADVERSE REACTION , PT TO AMBULATE ONCE MORE PRIOR TO DISCHARGE PER DR. Tamala Julian ORDER

## 2015-06-01 NOTE — Discharge Instructions (Signed)
Take Tylenol or Norco if needed for pain. May shower Avoid straining and heavy lifting.AMBULATORY SURGERY  DISCHARGE INSTRUCTIONS   1) The drugs that you were given will stay in your system until tomorrow so for the next 24 hours you should not:  A) Drive an automobile B) Make any legal decisions C) Drink any alcoholic beverage   2) You may resume regular meals tomorrow.  Today it is better to start with liquids and gradually work up to solid foods.  You may eat anything you prefer, but it is better to start with liquids, then soup and crackers, and gradually work up to solid foods.   3) Please notify your doctor immediately if you have any unusual bleeding, trouble breathing, redness and pain at the surgery site, drainage, fever, or pain not relieved by medication. 4)   5) Your post-operative visit with Dr.                                     is: Date:                        Time:    Please call to schedule your post-operative visit.  6) Additional Instructions:

## 2015-06-01 NOTE — Anesthesia Procedure Notes (Signed)
Procedure Name: Intubation Date/Time: 06/01/2015 7:39 AM Performed by: Courtney Paris Pre-anesthesia Checklist: Patient identified, Emergency Drugs available, Suction available and Patient being monitored Patient Re-evaluated:Patient Re-evaluated prior to inductionOxygen Delivery Method: Circle system utilized Preoxygenation: Pre-oxygenation with 100% oxygen Intubation Type: IV induction and Combination inhalational/ intravenous induction Ventilation: Mask ventilation without difficulty Laryngoscope Size: Miller and 3 Grade View: Grade II Tube type: Oral Tube size: 7.5 mm Number of attempts: 1 Airway Equipment and Method: Stylet Placement Confirmation: ETT inserted through vocal cords under direct vision,  positive ETCO2,  CO2 detector and breath sounds checked- equal and bilateral Secured at: 22 cm Tube secured with: Tape Dental Injury: Teeth and Oropharynx as per pre-operative assessment

## 2015-06-01 NOTE — Transfer of Care (Signed)
Immediate Anesthesia Transfer of Care Note  Patient: Kevin Mccullough  Procedure(s) Performed: Procedure(s): HERNIA REPAIR INGUINAL ADULT (Left)  Patient Location: PACU  Anesthesia Type:General  Level of Consciousness: awake, oriented and patient cooperative  Airway & Oxygen Therapy: Patient Spontanous Breathing  Post-op Assessment: Report given to RN and Post -op Vital signs reviewed and stable  Post vital signs: Reviewed and stable  Last Vitals:  Filed Vitals:   06/01/15 0930  BP: 128/75  Pulse: 85  Temp: 36.7 C  Resp: 8    Complications: No apparent anesthesia complications

## 2015-06-01 NOTE — Anesthesia Preprocedure Evaluation (Signed)
Anesthesia Evaluation  Patient identified by MRN, date of birth, ID band Patient awake    Reviewed: Allergy & Precautions, NPO status , Patient's Chart, lab work & pertinent test results  History of Anesthesia Complications (+) AWARENESS UNDER ANESTHESIA  Airway Mallampati: II       Dental  (+) Teeth Intact   Pulmonary neg pulmonary ROS, former smoker,           Cardiovascular negative cardio ROS       Neuro/Psych negative neurological ROS     GI/Hepatic negative GI ROS, Neg liver ROS, PUD,   Endo/Other  negative endocrine ROS  Renal/GU negative Renal ROS     Musculoskeletal   Abdominal   Peds  Hematology   Anesthesia Other Findings   Reproductive/Obstetrics                             Anesthesia Physical Anesthesia Plan  ASA: II  Anesthesia Plan: General   Post-op Pain Management:    Induction: Intravenous  Airway Management Planned: Oral ETT  Additional Equipment:   Intra-op Plan:   Post-operative Plan:   Informed Consent: I have reviewed the patients History and Physical, chart, labs and discussed the procedure including the risks, benefits and alternatives for the proposed anesthesia with the patient or authorized representative who has indicated his/her understanding and acceptance.     Plan Discussed with:   Anesthesia Plan Comments:         Anesthesia Quick Evaluation

## 2015-06-01 NOTE — OR Nursing (Signed)
PT. STOOD TO GET INTO WHEELCHAIR FOR DISCHARGE , BECAME DIZZY NAUSEATED , HR 40 , BP 92/61 , PT ASSSTED TO STRECHER HEAD OF BED DOWN , FLUIDS INCEASED , REGAINED CONSCIOUSNESS BP 102/57 , HR 71 . PT. REMAINED ON BED TO RECEIVE FLUIDS WIFE DSIDE.

## 2015-07-08 DIAGNOSIS — Z23 Encounter for immunization: Secondary | ICD-10-CM | POA: Diagnosis not present

## 2015-09-13 ENCOUNTER — Encounter: Payer: Self-pay | Admitting: Family Medicine

## 2015-09-13 ENCOUNTER — Ambulatory Visit (INDEPENDENT_AMBULATORY_CARE_PROVIDER_SITE_OTHER): Payer: Medicare HMO | Admitting: Family Medicine

## 2015-09-13 ENCOUNTER — Emergency Department
Admission: EM | Admit: 2015-09-13 | Discharge: 2015-09-13 | Disposition: A | Discharge status: Home / Self Care | Payer: Medicare HMO | Attending: Emergency Medicine | Admitting: Emergency Medicine

## 2015-09-13 ENCOUNTER — Encounter: Payer: Self-pay | Admitting: Emergency Medicine

## 2015-09-13 ENCOUNTER — Emergency Department: Payer: Medicare HMO

## 2015-09-13 VITALS — BP 108/76 | HR 64 | Temp 97.7°F | Resp 16 | Wt 245.8 lb

## 2015-09-13 DIAGNOSIS — Z79899 Other long term (current) drug therapy: Secondary | ICD-10-CM | POA: Insufficient documentation

## 2015-09-13 DIAGNOSIS — R319 Hematuria, unspecified: Secondary | ICD-10-CM | POA: Insufficient documentation

## 2015-09-13 DIAGNOSIS — M62830 Muscle spasm of back: Secondary | ICD-10-CM | POA: Diagnosis not present

## 2015-09-13 DIAGNOSIS — R000 Tachycardia, unspecified: Secondary | ICD-10-CM | POA: Diagnosis not present

## 2015-09-13 DIAGNOSIS — S39012A Strain of muscle, fascia and tendon of lower back, initial encounter: Secondary | ICD-10-CM

## 2015-09-13 DIAGNOSIS — Z87891 Personal history of nicotine dependence: Secondary | ICD-10-CM | POA: Diagnosis not present

## 2015-09-13 DIAGNOSIS — M545 Low back pain: Secondary | ICD-10-CM | POA: Insufficient documentation

## 2015-09-13 DIAGNOSIS — K8020 Calculus of gallbladder without cholecystitis without obstruction: Secondary | ICD-10-CM | POA: Diagnosis not present

## 2015-09-13 DIAGNOSIS — M549 Dorsalgia, unspecified: Secondary | ICD-10-CM | POA: Diagnosis not present

## 2015-09-13 LAB — URINALYSIS COMPLETE WITH MICROSCOPIC (ARMC ONLY)
Bacteria, UA: NONE SEEN
Bilirubin Urine: NEGATIVE
Glucose, UA: NEGATIVE mg/dL
HGB URINE DIPSTICK: NEGATIVE
Ketones, ur: NEGATIVE mg/dL
Leukocytes, UA: NEGATIVE
Nitrite: NEGATIVE
PH: 5 (ref 5.0–8.0)
PROTEIN: NEGATIVE mg/dL
SQUAMOUS EPITHELIAL / LPF: NONE SEEN
Specific Gravity, Urine: 1.024 (ref 1.005–1.030)

## 2015-09-13 LAB — CBC
HCT: 41.9 % (ref 40.0–52.0)
Hemoglobin: 14.2 g/dL (ref 13.0–18.0)
MCH: 32.6 pg (ref 26.0–34.0)
MCHC: 33.8 g/dL (ref 32.0–36.0)
MCV: 96.5 fL (ref 80.0–100.0)
PLATELETS: 273 10*3/uL (ref 150–440)
RBC: 4.34 MIL/uL — AB (ref 4.40–5.90)
RDW: 13.9 % (ref 11.5–14.5)
WBC: 10.5 10*3/uL (ref 3.8–10.6)

## 2015-09-13 LAB — COMPREHENSIVE METABOLIC PANEL
ALBUMIN: 4.2 g/dL (ref 3.5–5.0)
ALT: 25 U/L (ref 17–63)
AST: 22 U/L (ref 15–41)
Alkaline Phosphatase: 54 U/L (ref 38–126)
Anion gap: 5 (ref 5–15)
BUN: 14 mg/dL (ref 6–20)
CHLORIDE: 110 mmol/L (ref 101–111)
CO2: 26 mmol/L (ref 22–32)
CREATININE: 1.21 mg/dL (ref 0.61–1.24)
Calcium: 9.4 mg/dL (ref 8.9–10.3)
GFR calc non Af Amer: >60 mL/min (ref >60)
Glucose, Bld: 123 mg/dL — ABNORMAL HIGH (ref 65–99)
Potassium: 4.2 mmol/L (ref 3.5–5.1)
SODIUM: 141 mmol/L (ref 135–145)
Total Bilirubin: 0.8 mg/dL (ref 0.3–1.2)
Total Protein: 7.3 g/dL (ref 6.5–8.1)

## 2015-09-13 MED ORDER — CYCLOBENZAPRINE HCL 5 MG PO TABS: 5.0000 mg | ORAL_TABLET | Freq: Three times a day (TID) | ORAL | Status: DC | PRN

## 2015-09-13 MED ORDER — CELECOXIB 200 MG PO CAPS: 200.0000 mg | ORAL_CAPSULE | Freq: Two times a day (BID) | ORAL | Status: DC

## 2015-09-13 MED ORDER — OXYCODONE-ACETAMINOPHEN 5-325 MG PO TABS: 1.0000 | ORAL_TABLET | Freq: Four times a day (QID) | ORAL | Status: DC | PRN

## 2015-09-13 MED ORDER — MORPHINE SULFATE (PF) 4 MG/ML IV SOLN
4.0000 mg | Freq: Once | INTRAVENOUS | Status: AC
  Administered 2015-09-13: 4 mg via INTRAVENOUS
  Filled 2015-09-13: qty 1

## 2015-09-13 MED ORDER — DIAZEPAM 5 MG/ML IJ SOLN
5.0000 mg | Freq: Once | INTRAMUSCULAR | Status: AC
  Administered 2015-09-13: 5 mg via INTRAVENOUS
  Filled 2015-09-13: qty 2

## 2015-09-13 NOTE — Patient Instructions (Signed)
Take Celebrex with food.

## 2015-09-13 NOTE — ED Provider Notes (Signed)
[facility] Emergency Department Provider Note  Time seen: 8:18 PM  I have reviewed the triage vital signs and the nursing notes.   HISTORY  Chief Complaint Back Pain    HPI MORT ESTOCK is a 65 y.o. male with a past medical history of arthritis, who presents to the emergency department with back spasms. According to the patient for the past 4 days he has been experiencing lower back pain. Today he states he began experiencing spasm of his lower back muscles. He went to his primary care physician and was prescribed Celebrex and cyclobenzaprine. He has tried using his medications today without success. Patient continues to have worsening and more frequent spasms so he came to the emergency department for evaluation. Denies any weakness or numbness of either leg. Denies incontinence. States a history of kidney stones many years ago but states this feels different. Much worse with any type of attempted movement especially sitting up.Describes the pain as mild at baseline, 10/10 during spasm.   Past Medical History  Diagnosis Date  . Arthritis   . History of bleeding ulcers     Patient Active Problem List   Diagnosis Date Noted  . AA (alopecia areata) 03/24/2015  . Acid indigestion 03/24/2015  . H/O peptic ulcer 03/24/2015  . HLD (hyperlipidemia) 03/24/2015  . Arthritis, degenerative 03/24/2015  . Hypercholesterolemia without hypertriglyceridemia 03/24/2015    Past Surgical History  Procedure Laterality Date  . Splenectomy  1983  . Tonsillectomy  1954  . Arthroplasty Left 2003  . Back surgery  1983,1991,2010,2011    Spinal Fusion in 2011  . Joint replacement Left     Partial Hip Replacement  . Shoulder surgery Left   . Inguinal hernia repair Left 06/01/2015    Procedure: HERNIA REPAIR INGUINAL ADULT;  Surgeon: Leonie Green, MD;  Location: [facility] ORS;  Service: General;  Laterality: Left;    Current Outpatient Rx  Name  Route  Sig  Dispense   Refill  . celecoxib (CELEBREX) 200 MG capsule   Oral   Take 1 capsule (200 mg total) by mouth 2 (two) times daily. With food   28 capsule   1   . Cholecalciferol (VITAMIN D3) 2000 UNITS TABS   Oral   Take 1 tablet by mouth daily.         . cyclobenzaprine (FLEXERIL) 5 MG tablet   Oral   Take 1 tablet (5 mg total) by mouth 3 (three) times daily as needed for muscle spasms.   30 tablet   1   . MULTIPLE VITAMINS-MINERALS PO   Oral   Take 1 tablet by mouth daily.         . OMEGA-3 FATTY ACIDS PO   Oral   Take 1 tablet by mouth daily.           Allergies Review of patient's allergies indicates no known allergies.  Family History  Problem Relation Age of Onset  . Liver disease Mother   . Arthritis Sister   . Diabetes Sister   . Multiple sclerosis Daughter   . Arthritis Sister   . Arthritis Sister     Social History Social History  Substance Use Topics  . Smoking status: Former Smoker -- 1.00 packs/day for 17 years    Types: Cigarettes    Quit date: 11/27/1987  . Smokeless tobacco: Never Used     Comment: QUIT IN 1989  . Alcohol Use: No    Review of Systems Constitutional: Negative for  fever Cardiovascular: Negative for chest pain. Respiratory: Negative for shortness of breath. Gastrointestinal: Negative for abdominal pain Genitourinary: Negative for dysuria. Exit for hematuria. Musculoskeletal: Positive for back pain and spasm. Neurological: Negative for headache 10-point ROS otherwise negative.  ____________________________________________   PHYSICAL EXAM:  VITAL SIGNS: ED Triage Vitals  Enc Vitals Group     BP 09/13/15 2001 126/66 mmHg     Pulse Rate 09/13/15 2001 77     Resp 09/13/15 2001 18     Temp 09/13/15 2001 97.8 F (36.6 C)     Temp Source 09/13/15 2001 Oral     SpO2 09/13/15 2001 96 %     Weight 09/13/15 2001 245 lb (111.131 kg)     Height 09/13/15 2001 5\' 10"  (1.778 m)     Head Cir --      Peak Flow --      Pain Score  09/13/15 2002 2     Pain Loc --      Pain Edu? --      Excl. in [place]? --     Constitutional: Alert and oriented. Well appearing and in no distress. Eyes: Normal exam ENT   Head: Normocephalic and atraumatic.   Mouth/Throat: Mucous membranes are moist. Cardiovascular: Normal rate, regular rhythm. No murmur Respiratory: Normal respiratory effort without tachypnea nor retractions. Breath sounds are clear and equal bilaterally. No wheezes/rales/rhonchi. Gastrointestinal: Soft and nontender. No distention.  Musculoskeletal: Nontender with normal range of motion in all extremities. Neurologic:  Normal speech and language. No gross focal neurologic deficits  Skin:  Skin is warm, dry and intact.  Psychiatric: Mood and affect are normal. Speech and behavior are normal.   ____________________________________________     RADIOLOGY  CT shows no acute abnormality  ____________________________________________    INITIAL IMPRESSION / ASSESSMENT AND PLAN / ED COURSE  Pertinent labs & imaging results that were available during my care of the patient were reviewed by me and considered in my medical decision making (see chart for details).  Patient presents with an exam most consistent with muscle spasm. However the patient has no tenderness to palpation of his back muscles. Pain is much worse with movement. Given his history of kidney stones in the past we will check labs and proceed with a CT renal scan to rule out more concerning intracranial pathology. We will treat with IV Valium, and continue to closely monitor.  Labs and CT largely within normal limits. Patient continues to have spasms, but states that they have reduced in intensity compared to earlier. We will try IV morphine and continue to monitor in the emergency department. With a normal workup otherwise plan to discharge home with pain medication and primary care  follow-up.  ____________________________________________   FINAL CLINICAL IMPRESSION(S) / ED DIAGNOSES  Muscle spasm Back pain  Harvest Dark, MD 09/13/15 2135

## 2015-09-13 NOTE — Progress Notes (Signed)
Subjective:     Patient ID: Kevin Mccullough, male   DOB: Oct 08, 1950, 66 y.o.   MRN: [id number]  HPI  Chief Complaint  Patient presents with  . Back Pain    Patient comes in office today with concerns of back pain for the past 4-5 days he states that is gradually is getting worse. Patient states that he has pain when trying to get up and down and describes feeling as "catching itself". Patient denies any injury or heavy lifting.   Denies radiation of pain. Due to old history of peptic ulcers has avoided ibuprofen. "I am retired now."  Review of Systems     Objective:   Physical Exam  Constitutional: He appears well-developed and well-nourished. He appears distressed (back spasms when changing positions.).  Musculoskeletal:  Muscle strength 5/5 in lower extremities. SLR to 45 degrees only due to hamstring tightness but nor radiation of pain when doing so. Localizes to his left lumbar paravertebral area. Non-tender to palpation.       Assessment:    1. Low back strain, initial encounter - celecoxib (CELEBREX) 200 MG capsule; Take 1 capsule (200 mg total) by mouth 2 (two) times daily. With food  Dispense: 28 capsule; Refill: 1 - cyclobenzaprine (FLEXERIL) 5 MG tablet; Take 1 tablet (5 mg total) by mouth 3 (three) times daily as needed for muscle spasms.  Dispense: 30 tablet; Refill: 1    Plan:    Further f/u in not improving over the next two weeks.

## 2015-09-13 NOTE — Discharge Instructions (Signed)
Please take your pain medication as needed, as prescribed. Please follow-up with her primary care physician in 2-3 days for recheck/reevaluation. Return to the emergency department for any worsening pain, or any other personally concerning symptoms.   Muscle Cramps and Spasms Muscle cramps and spasms occur when a muscle or muscles tighten and you have no control over this tightening (involuntary muscle contraction). They are a common problem and can develop in any muscle. The most common place is in the calf muscles of the leg. Both muscle cramps and muscle spasms are involuntary muscle contractions, but they also have differences:   Muscle cramps are sporadic and painful. They may last a few seconds to a quarter of an hour. Muscle cramps are often more forceful and last longer than muscle spasms.  Muscle spasms may or may not be painful. They may also last just a few seconds or much longer. CAUSES  It is uncommon for cramps or spasms to be due to a serious underlying problem. In many cases, the cause of cramps or spasms is unknown. Some common causes are:   Overexertion.   Overuse from repetitive motions (doing the same thing over and over).   Remaining in a certain position for a long period of time.   Improper preparation, form, or technique while performing a sport or activity.   Dehydration.   Injury.   Side effects of some medicines.   Abnormally low levels of the salts and ions in your blood (electrolytes), especially potassium and calcium. This could happen if you are taking water pills (diuretics) or you are pregnant.  Some underlying medical problems can make it more likely to develop cramps or spasms. These include, but are not limited to:   Diabetes.   Parkinson disease.   Hormone disorders, such as thyroid problems.   Alcohol abuse.   Diseases specific to muscles, joints, and bones.   Blood vessel disease where not enough blood is getting to the  muscles.  HOME CARE INSTRUCTIONS   Stay well hydrated. Drink enough water and fluids to keep your urine clear or pale yellow.  It may be helpful to massage, stretch, and relax the affected muscle.  For tight or tense muscles, use a warm towel, heating pad, or hot shower water directed to the affected area.  If you are sore or have pain after a cramp or spasm, applying ice to the affected area may relieve discomfort.  Put ice in a plastic bag.  Place a towel between your skin and the bag.  Leave the ice on for 15-20 minutes, 03-04 times a day.  Medicines used to treat a known cause of cramps or spasms may help reduce their frequency or severity. Only take over-the-counter or prescription medicines as directed by your caregiver. SEEK MEDICAL CARE IF:  Your cramps or spasms get more severe, more frequent, or do not improve over time.  MAKE SURE YOU:   Understand these instructions.  Will watch your condition.  Will get help right away if you are not doing well or get worse.   This information is not intended to replace advice given to you by your health care provider. Make sure you discuss any questions you have with your health care provider.   Document Released: 02/03/2002 Document Revised: 12/09/2012 Document Reviewed: 07/31/2012 Elsevier Interactive Patient Education [employer].

## 2015-09-13 NOTE — ED Notes (Signed)
Pt to ED via [facility] from home c/o back spasms that started around 1900 tonight.  Pt states was walking out of the bathroom when sudden back spasms to lower left back started.  Pt states started 4-5 days ago and getting worse. Pt made appt and was seen at [facility] this morning and prescribed celebrex and cyclobenzoprine and took both around 1700 this afternoon.  Pt has hx of back spasms and back fusion in 2011.  Pt was given 250mg  fentanyl en route via EMS.  Pt presents A&Ox4, speaking in complete and coherent sentences, VSS, and in intermittent pain to lower back.

## 2015-10-06 DIAGNOSIS — Z1211 Encounter for screening for malignant neoplasm of colon: Secondary | ICD-10-CM | POA: Diagnosis not present

## 2015-10-06 DIAGNOSIS — R1013 Epigastric pain: Secondary | ICD-10-CM | POA: Diagnosis not present

## 2015-10-13 DIAGNOSIS — R69 Illness, unspecified: Secondary | ICD-10-CM | POA: Diagnosis not present

## 2015-10-28 ENCOUNTER — Ambulatory Visit: Payer: Medicare HMO | Admitting: Anesthesiology

## 2015-10-28 ENCOUNTER — Ambulatory Visit
Admission: RE | Admit: 2015-10-28 | Discharge: 2015-10-28 | Disposition: A | Discharge status: Home / Self Care | Payer: Medicare HMO | Source: Ambulatory Visit | Attending: Unknown Physician Specialty | Admitting: Unknown Physician Specialty

## 2015-10-28 ENCOUNTER — Encounter
Admission: RE | Disposition: A | Discharge status: Home / Self Care | Payer: Self-pay | Source: Ambulatory Visit | Attending: Unknown Physician Specialty

## 2015-10-28 DIAGNOSIS — Z9889 Other specified postprocedural states: Secondary | ICD-10-CM | POA: Diagnosis not present

## 2015-10-28 DIAGNOSIS — K210 Gastro-esophageal reflux disease with esophagitis: Secondary | ICD-10-CM | POA: Diagnosis not present

## 2015-10-28 DIAGNOSIS — K3189 Other diseases of stomach and duodenum: Secondary | ICD-10-CM | POA: Insufficient documentation

## 2015-10-28 DIAGNOSIS — Z79891 Long term (current) use of opiate analgesic: Secondary | ICD-10-CM | POA: Insufficient documentation

## 2015-10-28 DIAGNOSIS — Z820 Family history of epilepsy and other diseases of the nervous system: Secondary | ICD-10-CM | POA: Diagnosis not present

## 2015-10-28 DIAGNOSIS — Z8261 Family history of arthritis: Secondary | ICD-10-CM | POA: Insufficient documentation

## 2015-10-28 DIAGNOSIS — Z833 Family history of diabetes mellitus: Secondary | ICD-10-CM | POA: Insufficient documentation

## 2015-10-28 DIAGNOSIS — K635 Polyp of colon: Secondary | ICD-10-CM | POA: Diagnosis not present

## 2015-10-28 DIAGNOSIS — K2270 Barrett's esophagus without dysplasia: Secondary | ICD-10-CM | POA: Diagnosis not present

## 2015-10-28 DIAGNOSIS — Z87891 Personal history of nicotine dependence: Secondary | ICD-10-CM | POA: Diagnosis not present

## 2015-10-28 DIAGNOSIS — Z981 Arthrodesis status: Secondary | ICD-10-CM | POA: Insufficient documentation

## 2015-10-28 DIAGNOSIS — Z8379 Family history of other diseases of the digestive system: Secondary | ICD-10-CM | POA: Diagnosis not present

## 2015-10-28 DIAGNOSIS — R1013 Epigastric pain: Secondary | ICD-10-CM | POA: Insufficient documentation

## 2015-10-28 DIAGNOSIS — Z96642 Presence of left artificial hip joint: Secondary | ICD-10-CM | POA: Diagnosis not present

## 2015-10-28 DIAGNOSIS — K648 Other hemorrhoids: Secondary | ICD-10-CM | POA: Diagnosis not present

## 2015-10-28 DIAGNOSIS — Z79899 Other long term (current) drug therapy: Secondary | ICD-10-CM | POA: Diagnosis not present

## 2015-10-28 DIAGNOSIS — K640 First degree hemorrhoids: Secondary | ICD-10-CM | POA: Insufficient documentation

## 2015-10-28 DIAGNOSIS — Z791 Long term (current) use of non-steroidal anti-inflammatories (NSAID): Secondary | ICD-10-CM | POA: Diagnosis not present

## 2015-10-28 DIAGNOSIS — Z1211 Encounter for screening for malignant neoplasm of colon: Secondary | ICD-10-CM | POA: Insufficient documentation

## 2015-10-28 DIAGNOSIS — D125 Benign neoplasm of sigmoid colon: Secondary | ICD-10-CM | POA: Diagnosis not present

## 2015-10-28 DIAGNOSIS — D124 Benign neoplasm of descending colon: Secondary | ICD-10-CM | POA: Insufficient documentation

## 2015-10-28 DIAGNOSIS — M1990 Unspecified osteoarthritis, unspecified site: Secondary | ICD-10-CM | POA: Diagnosis not present

## 2015-10-28 DIAGNOSIS — R12 Heartburn: Secondary | ICD-10-CM | POA: Diagnosis not present

## 2015-10-28 DIAGNOSIS — Z9081 Acquired absence of spleen: Secondary | ICD-10-CM | POA: Insufficient documentation

## 2015-10-28 HISTORY — PX: COLONOSCOPY WITH PROPOFOL: SHX5780

## 2015-10-28 HISTORY — PX: ESOPHAGOGASTRODUODENOSCOPY (EGD) WITH PROPOFOL: SHX5813

## 2015-10-28 LAB — SURGICAL PATHOLOGY

## 2015-10-28 SURGERY — COLONOSCOPY WITH PROPOFOL
Anesthesia: General

## 2015-10-28 MED ORDER — PROPOFOL 500 MG/50ML IV EMUL
INTRAVENOUS | Status: DC | PRN
  Administered 2015-10-28: 100 ug/kg/min via INTRAVENOUS

## 2015-10-28 MED ORDER — SODIUM CHLORIDE 0.9 % IV SOLN
INTRAVENOUS | Status: DC
  Administered 2015-10-28: 1000 mL via INTRAVENOUS

## 2015-10-28 MED ORDER — FENTANYL CITRATE (PF) 100 MCG/2ML IJ SOLN
INTRAMUSCULAR | Status: DC | PRN
  Administered 2015-10-28: 50 ug via INTRAVENOUS

## 2015-10-28 MED ORDER — LIDOCAINE HCL (PF) 2 % IJ SOLN
INTRAMUSCULAR | Status: DC | PRN
  Administered 2015-10-28: 60 mg

## 2015-10-28 MED ORDER — PHENYLEPHRINE HCL 10 MG/ML IJ SOLN
INTRAMUSCULAR | Status: DC | PRN
  Administered 2015-10-28: 100 ug via INTRAVENOUS
  Administered 2015-10-28 (×2): 200 ug via INTRAVENOUS

## 2015-10-28 MED ORDER — MIDAZOLAM HCL 5 MG/5ML IJ SOLN
INTRAMUSCULAR | Status: DC | PRN
  Administered 2015-10-28 (×2): 1 mg via INTRAVENOUS

## 2015-10-28 MED ORDER — PROPOFOL 10 MG/ML IV BOLUS
INTRAVENOUS | Status: DC | PRN
  Administered 2015-10-28: 50 mg via INTRAVENOUS

## 2015-10-28 MED ORDER — PIPERACILLIN-TAZOBACTAM 3.375 G IVPB 30 MIN
3.3750 g | Freq: Once | INTRAVENOUS | Status: AC
  Administered 2015-10-28: 3.375 g via INTRAVENOUS
  Filled 2015-10-28: qty 50

## 2015-10-28 MED ORDER — GLYCOPYRROLATE 0.2 MG/ML IJ SOLN
INTRAMUSCULAR | Status: DC | PRN
  Administered 2015-10-28: 0.2 mg via INTRAVENOUS

## 2015-10-28 NOTE — Transfer of Care (Signed)
Immediate Anesthesia Transfer of Care Note  Patient: Kevin Mccullough  Procedure(s) Performed: Procedure(s): COLONOSCOPY WITH PROPOFOL (N/A) ESOPHAGOGASTRODUODENOSCOPY (EGD) WITH PROPOFOL (N/A)  Patient Location: PACU  Anesthesia Type:General  Level of Consciousness: sedated  Airway & Oxygen Therapy: Patient Spontanous Breathing and Patient connected to nasal cannula oxygen  Post-op Assessment: Report given to RN and Post -op Vital signs reviewed and stable  Post vital signs: Reviewed and stable  Last Vitals:  Filed Vitals:   10/28/15 0917 10/28/15 1049  BP: 129/53 100/53  Pulse: 96 85  Temp: 36.7 C   Resp: 17 18    Complications: No apparent anesthesia complications

## 2015-10-28 NOTE — Anesthesia Preprocedure Evaluation (Signed)
Anesthesia Evaluation  Patient identified by MRN, date of birth, ID band Patient awake    Reviewed: Allergy & Precautions, NPO status , Patient's Chart, lab work & pertinent test results  Airway Mallampati: II  TM Distance: >3 FB     Dental  (+) Chipped   Pulmonary former smoker,    Pulmonary exam normal        Cardiovascular negative cardio ROS Normal cardiovascular exam     Neuro/Psych negative neurological ROS  negative psych ROS   GI/Hepatic Neg liver ROS, PUD,   Endo/Other  negative endocrine ROS  Renal/GU negative Renal ROS  negative genitourinary   Musculoskeletal  (+) Arthritis , Osteoarthritis,    Abdominal Normal abdominal exam  (+)   Peds negative pediatric ROS (+)  Hematology negative hematology ROS (+)   Anesthesia Other Findings   Reproductive/Obstetrics                             Anesthesia Physical Anesthesia Plan  ASA: II  Anesthesia Plan: General   Post-op Pain Management:    Induction: Intravenous  Airway Management Planned: Nasal Cannula  Additional Equipment:   Intra-op Plan:   Post-operative Plan:   Informed Consent: I have reviewed the patients History and Physical, chart, labs and discussed the procedure including the risks, benefits and alternatives for the proposed anesthesia with the patient or authorized representative who has indicated his/her understanding and acceptance.   Dental advisory given  Plan Discussed with: CRNA and Surgeon  Anesthesia Plan Comments:         Anesthesia Quick Evaluation

## 2015-10-28 NOTE — Op Note (Signed)
[facility] Patient Name: Kevin Mccullough Procedure Date: 10/28/2015 10:14 AM MRN: [id number] Account #: [id number] Date of Birth: 1950-09-24 Admit Type: Outpatient Age: 65 Room: [facility] ENDO ROOM 1 Gender: Male Note Status: Finalized Procedure:            Upper GI endoscopy Indications:          Heartburn Providers:            Manya Silvas, MD Referring MD:         Vickki Muff. Chrismon, MD (Referring MD) Medicines:            Propofol per Anesthesia Complications:        No immediate complications. Procedure:            Pre-Anesthesia Assessment:                       - After reviewing the risks and benefits, the patient                        was deemed in satisfactory condition to undergo the                        procedure.                       After obtaining informed consent, the endoscope was                        passed under direct vision. Throughout the procedure,                        the patient's blood pressure, pulse, and oxygen                        saturations were monitored continuously. The Endoscope                        was introduced through the mouth, and advanced to the                        second part of duodenum. The upper GI endoscopy was                        accomplished without difficulty. The patient tolerated                        the procedure well. Findings:      The examined esophagus was normal. Slight irregular border with stomch       and to rule out Barretts Biopsies were taken with a cold forceps for       histology. GEJ 41cm.      The stomach was normal.      The examined duodenum was normal. Impression:           - Normal esophagus. Biopsied.                       - Normal stomach.                       - Normal examined duodenum. Recommendation:       - Await pathology results. Do  colonoscopy Manya Silvas, MD 10/28/2015 10:25:48 AM This report has been signed electronically. Number of  Addenda: 0 Note Initiated On: 10/28/2015 10:14 AM      [facility]

## 2015-10-28 NOTE — Anesthesia Postprocedure Evaluation (Signed)
Anesthesia Post Note  Patient: Kevin Mccullough  Procedure(s) Performed: Procedure(s) (LRB): COLONOSCOPY WITH PROPOFOL (N/A) ESOPHAGOGASTRODUODENOSCOPY (EGD) WITH PROPOFOL (N/A)  Patient location during evaluation: PACU Anesthesia Type: General Level of consciousness: awake and alert and oriented Pain management: pain level controlled Vital Signs Assessment: post-procedure vital signs reviewed and stable Respiratory status: spontaneous breathing Cardiovascular status: blood pressure returned to baseline Anesthetic complications: no    Last Vitals:  Filed Vitals:   10/28/15 1110 10/28/15 1120  BP: 85/58 94/67  Pulse: 76 81  Temp:    Resp: 15 13    Last Pain: There were no vitals filed for this visit.               Mikya Don

## 2015-10-28 NOTE — H&P (Signed)
Primary Care Physician:  Vernie Murders, PA Primary Gastroenterologist:  Dr. Vira Agar  Pre-Procedure History & Physical: HPI:  Kevin Mccullough is a 65 y.o. male is here for an EGD and a  colonoscopy.   Past Medical History  Diagnosis Date  . Arthritis   . History of bleeding ulcers     Past Surgical History  Procedure Laterality Date  . Splenectomy  1983  . Tonsillectomy  1954  . Arthroplasty Left 2003  . Back surgery  1983,1991,2010,2011    Spinal Fusion in 2011  . Shoulder surgery Left   . Inguinal hernia repair Left 06/01/2015    Procedure: HERNIA REPAIR INGUINAL ADULT;  Surgeon: Leonie Green, MD;  Location: [facility] ORS;  Service: General;  Laterality: Left;  . Hernia repair    . Joint replacement Left     Partial Hip Replacement    Prior to Admission medications   Medication Sig Start Date End Date Taking? Authorizing Provider  celecoxib (CELEBREX) 200 MG capsule Take 1 capsule (200 mg total) by mouth 2 (two) times daily. With food 09/13/15   Carmon Ginsberg, PA  Cholecalciferol (VITAMIN D3) 2000 UNITS TABS Take 1 tablet by mouth daily.    Historical Provider, MD  cyclobenzaprine (FLEXERIL) 5 MG tablet Take 1 tablet (5 mg total) by mouth 3 (three) times daily as needed for muscle spasms. 09/13/15   Carmon Ginsberg, PA  MULTIPLE VITAMINS-MINERALS PO Take 1 tablet by mouth daily.    Historical Provider, MD  OMEGA-3 FATTY ACIDS PO Take 1 tablet by mouth daily.    Historical Provider, MD  oxyCODONE-acetaminophen (ROXICET) 5-325 MG tablet Take 1 tablet by mouth every 6 (six) hours as needed. 09/13/15   Harvest Dark, MD    Allergies as of 10/21/2015  . (No Known Allergies)    Family History  Problem Relation Age of Onset  . Liver disease Mother   . Arthritis Sister   . Diabetes Sister   . Multiple sclerosis Daughter   . Arthritis Sister   . Arthritis Sister     Social History   Social History  . Marital Status: Married    Spouse Name: N/A  . Number of  Children: N/A  . Years of Education: N/A   Occupational History  . Not on file.   Social History Main Topics  . Smoking status: Former Smoker -- 1.00 packs/day for 17 years    Types: Cigarettes    Quit date: 11/27/1987  . Smokeless tobacco: Never Used     Comment: QUIT IN 1989  . Alcohol Use: No  . Drug Use: No  . Sexual Activity: Not on file   Other Topics Concern  . Not on file   Social History Narrative    Review of Systems: See HPI, otherwise negative ROS  Physical Exam: BP 129/53 mmHg  Pulse 96  Temp(Src) 98 F (36.7 C) (Tympanic)  Resp 17  Ht 5\' 8"  (1.727 m)  Wt 110.678 kg (244 lb)  BMI 37.11 kg/m2  SpO2 99% General:   Alert,  pleasant and cooperative in NAD Head:  Normocephalic and atraumatic. Neck:  Supple; no masses or thyromegaly. Lungs:  Clear throughout to auscultation.    Heart:  Regular rate and rhythm. Abdomen:  Soft, nontender and nondistended. Normal bowel sounds, without guarding, and without rebound.   Neurologic:  Alert and  oriented x4;  grossly normal neurologically.  Impression/Plan: Kevin Mccullough is here for an colonoscopy and an EGD  to be performed for  screening and epigastric abdominal pain.  Risks, benefits, limitations, and alternatives regarding  endoscopy and colonoscopy have been reviewed with the patient.  Questions have been answered.  All parties agreeable.   Gaylyn Cheers, MD  10/28/2015, 10:11 AM

## 2015-10-28 NOTE — Op Note (Addendum)
[facility] Gastroenterology Patient Name: Kevin Mccullough Procedure Date: 10/28/2015 10:13 AM MRN: [id number] Account #: [id number] Date of Birth: 1951-08-06 Admit Type: Outpatient Age: 65 Room: [facility] ENDO ROOM 1 Gender: Male Note Status: Supervisor Override Procedure:            Colonoscopy Indications:          Screening for colorectal malignant neoplasm Providers:            Manya Silvas, MD Referring MD:         Vernie Murders, P.A. (Referring MD) Medicines:            Propofol per Anesthesia Complications:        No immediate complications. Procedure:            Pre-Anesthesia Assessment:                       - After reviewing the risks and benefits, the patient                        was deemed in satisfactory condition to undergo the                        procedure.                       After obtaining informed consent, the colonoscope was                        passed under direct vision. Throughout the procedure,                        the patient's blood pressure, pulse, and oxygen                        saturations were monitored continuously. The                        Colonoscope was introduced through the anus and                        advanced to the the cecum, identified by appendiceal                        orifice and ileocecal valve. The colonoscopy was                        performed without difficulty. The patient tolerated the                        procedure well. The quality of the bowel preparation                        was excellent. Findings:      Three sessile polyps were found in the sigmoid colon and descending       colon. The polyps were diminutive in size. These polyps were removed       with a jumbo cold forceps. Resection and retrieval were complete.      Internal hemorrhoids were found during endoscopy. The hemorrhoids were       small and Grade I (internal hemorrhoids that do not prolapse).  The exam was  otherwise without abnormality. Impression:           - Three diminutive polyps in the sigmoid colon and in                        the descending colon, removed with a jumbo cold                        forceps. Resected and retrieved.                       - Internal hemorrhoids.                       - The examination was otherwise normal. Recommendation:       - Await pathology results. Manya Silvas, MD 10/28/2015 10:47:52 AM This report has been signed electronically. Number of Addenda: 0 Note Initiated On: 10/28/2015 10:13 AM Scope Withdrawal Time: 0 hours 11 minutes 58 seconds  Total Procedure Duration: 0 hours 18 minutes 3 seconds       [facility]

## 2015-11-02 ENCOUNTER — Encounter: Payer: Self-pay | Admitting: Family Medicine

## 2015-11-02 ENCOUNTER — Ambulatory Visit (INDEPENDENT_AMBULATORY_CARE_PROVIDER_SITE_OTHER): Payer: Medicare HMO | Admitting: Family Medicine

## 2015-11-02 VITALS — BP 122/74 | HR 88 | Temp 98.5°F | Resp 16 | Ht 68.0 in | Wt 256.0 lb

## 2015-11-02 DIAGNOSIS — G6289 Other specified polyneuropathies: Secondary | ICD-10-CM | POA: Diagnosis not present

## 2015-11-02 DIAGNOSIS — R5082 Postprocedural fever: Secondary | ICD-10-CM

## 2015-11-02 MED ORDER — PREGABALIN 50 MG PO CAPS: 50.0000 mg | ORAL_CAPSULE | Freq: Three times a day (TID) | ORAL | Status: DC

## 2015-11-02 NOTE — Progress Notes (Signed)
Patient ID: Kevin Mccullough, male   DOB: October 09, 1950, 65 y.o.   MRN: [id number]       Patient: Kevin Mccullough Male    DOB: 25-Jul-1951   65 y.o.   MRN: [id number] Visit Date: 11/02/2015  Today's Provider: Vernie Murders, PA   Chief Complaint  Patient presents with  . Foot Pain    since yesterday.    Subjective:    Foot Pain This is a recurrent problem. The current episode started yesterday. The problem occurs intermittently. The problem has been gradually worsening. Associated symptoms include a fever. The symptoms are aggravated by walking. He has tried nothing for the symptoms.  Fever  This is a new problem. The current episode started yesterday. Episode frequency: Once. The maximum temperature noted was 101 to 101.9 F (no recurrence). Associated symptoms comments: Had colonoscopy and upper endoscopy yesterday morning (Dr. Vira Agar reported normal exams without pathology). No URI symptoms. Some chills and headache with short term fever yesterday. No hematochezia, nausea or vomiting..   Patient reports that his pain is located on the lateral aspect of his right foot. He describes pain as a shooting, stabbing pain that comes and does. Patient denies any injury. He reports that this happens about once or twice a year. He reports that each episode only lasts about 3-4 seconds occuring every 2-5 mins.   Past Medical History  Diagnosis Date  . Arthritis   . History of bleeding ulcers    Patient Active Problem List   Diagnosis Date Noted  . AA (alopecia areata) 03/24/2015  . Acid indigestion 03/24/2015  . H/O peptic ulcer 03/24/2015  . HLD (hyperlipidemia) 03/24/2015  . Arthritis, degenerative 03/24/2015  . Hypercholesterolemia without hypertriglyceridemia 03/24/2015   Past Surgical History  Procedure Laterality Date  . Splenectomy  1983  . Tonsillectomy  1954  . Arthroplasty Left 2003  . Back surgery  1983,1991,2010,2011    Spinal Fusion in 2011  . Shoulder surgery Left   .  Inguinal hernia repair Left 06/01/2015    Procedure: HERNIA REPAIR INGUINAL ADULT;  Surgeon: Leonie Green, MD;  Location: [facility] ORS;  Service: General;  Laterality: Left;  . Hernia repair    . Joint replacement Left     Partial Hip Replacement  . Colonoscopy with propofol N/A 10/28/2015    Procedure: COLONOSCOPY WITH PROPOFOL;  Surgeon: Manya Silvas, MD;  Location: [facility] ENDOSCOPY;  Service: Endoscopy;  Laterality: N/A;  . Esophagogastroduodenoscopy (egd) with propofol N/A 10/28/2015    Procedure: ESOPHAGOGASTRODUODENOSCOPY (EGD) WITH PROPOFOL;  Surgeon: Manya Silvas, MD;  Location: [facility] ENDOSCOPY;  Service: Endoscopy;  Laterality: N/A;   Family History  Problem Relation Age of Onset  . Liver disease Mother   . Arthritis Sister   . Diabetes Sister   . Multiple sclerosis Daughter   . Arthritis Sister   . Arthritis Sister    No Known Allergies   Previous Medications   CELECOXIB (CELEBREX) 200 MG CAPSULE    Take 1 capsule (200 mg total) by mouth 2 (two) times daily. With food   CHOLECALCIFEROL (VITAMIN D3) 2000 UNITS TABS    Take 1 tablet by mouth daily.   CYCLOBENZAPRINE (FLEXERIL) 5 MG TABLET    Take 1 tablet (5 mg total) by mouth 3 (three) times daily as needed for muscle spasms.   MULTIPLE VITAMINS-MINERALS PO    Take 1 tablet by mouth daily.   OMEGA-3 FATTY ACIDS PO    Take 1 tablet by mouth daily.  OMEPRAZOLE (PRILOSEC) 20 MG CAPSULE    Take 20 mg by mouth daily.   OXYCODONE-ACETAMINOPHEN (ROXICET) 5-325 MG TABLET    Take 1 tablet by mouth every 6 (six) hours as needed.    Review of Systems  Constitutional: Positive for fever.  Musculoskeletal: Positive for gait problem.  Neurological: Negative.     Social History  Substance Use Topics  . Smoking status: Former Smoker -- 1.00 packs/day for 17 years    Types: Cigarettes    Quit date: 11/27/1987  . Smokeless tobacco: Never Used     Comment: QUIT IN 1989  . Alcohol Use: No   Objective:   BP 122/74 mmHg  Pulse  88  Temp(Src) 98.5 F (36.9 C)  Resp 16  Ht 5\' 8"  (1.727 m)  Wt 256 lb (116.121 kg)  BMI 38.93 kg/m2  Physical Exam  Constitutional: He is oriented to person, place, and time. He appears well-developed and well-nourished.  HENT:  Head: Normocephalic.  Right Ear: External ear normal.  Left Ear: External ear normal.  Eyes: Conjunctivae and EOM are normal.  Neck: Normal range of motion. Neck supple.  Cardiovascular: Normal rate, regular rhythm and normal heart sounds.   Pulmonary/Chest: Effort normal and breath sounds normal.  Abdominal: Soft. Bowel sounds are normal.  Musculoskeletal:  Intermittent shooting pains in the lateral side of the right foot. No known injury. No bruising, swelling, ulcerations or tenderness to palpate. Pulses are 2+. No Achille's tenderness. Normal sensation to test with nylon string.  Lymphadenopathy:    He has no cervical adenopathy.  Neurological: He is alert and oriented to person, place, and time.  Skin: No rash noted.      Assessment & Plan:     1. Other polyneuropathy ([place]) Intermittent sharp shooting pains in the lateral edge of the right foot that started yesterday. Has had similar episodes in the past. Will give trial of Lyrica 50 mg TID and recheck prn. If no better with this medication, will need referral to podiatrist.  2. Post-procedural fever Spike of fever yesterday after upper and lower endoscopic evaluation by Dr. Vira Agar. Will check labs for signs of infection. Advised patient to contact Dr. Vira Agar, also, to give a progress report of post procedural fever. - CBC with Differential/Platelet - Comprehensive metabolic panel        Vernie Murders, [place]

## 2015-11-03 LAB — CBC WITH DIFFERENTIAL/PLATELET
Basophils Absolute: 0 10*3/uL (ref 0.0–0.2)
Basos: 0 %
EOS (ABSOLUTE): 0.1 10*3/uL (ref 0.0–0.4)
EOS: 0 %
HEMATOCRIT: 41.8 % (ref 37.5–51.0)
Hemoglobin: 14.7 g/dL (ref 12.6–17.7)
Immature Grans (Abs): 0.1 10*3/uL (ref 0.0–0.1)
Immature Granulocytes: 1 %
LYMPHS ABS: 2.9 10*3/uL (ref 0.7–3.1)
Lymphs: 14 %
MCH: 32 pg (ref 26.6–33.0)
MCHC: 35.2 g/dL (ref 31.5–35.7)
MCV: 91 fL (ref 79–97)
MONOS ABS: 2 10*3/uL — AB (ref 0.1–0.9)
Monocytes: 9 %
Neutrophils Absolute: 16.3 10*3/uL — ABNORMAL HIGH (ref 1.4–7.0)
Neutrophils: 76 %
Platelets: 293 10*3/uL (ref 150–379)
RBC: 4.6 x10E6/uL (ref 4.14–5.80)
RDW: 13.6 % (ref 12.3–15.4)
WBC: 21.4 10*3/uL — AB (ref 3.4–10.8)

## 2015-11-03 LAB — COMPREHENSIVE METABOLIC PANEL
A/G RATIO: 1.7 (ref 1.1–2.5)
ALK PHOS: 68 IU/L (ref 39–117)
ALT: 29 IU/L (ref 0–44)
AST: 25 IU/L (ref 0–40)
Albumin: 4.3 g/dL (ref 3.6–4.8)
BUN/Creatinine Ratio: 8 — ABNORMAL LOW (ref 10–22)
BUN: 10 mg/dL (ref 8–27)
Bilirubin Total: 1.4 mg/dL — ABNORMAL HIGH (ref 0.0–1.2)
CO2: 23 mmol/L (ref 18–29)
Calcium: 9.7 mg/dL (ref 8.6–10.2)
Chloride: 102 mmol/L (ref 96–106)
Creatinine, Ser: 1.31 mg/dL — ABNORMAL HIGH (ref 0.76–1.27)
GFR calc Af Amer: 66 mL/min/{1.73_m2} (ref >59)
GFR calc non Af Amer: 57 mL/min/{1.73_m2} — ABNORMAL LOW (ref >59)
GLOBULIN, TOTAL: 2.5 g/dL (ref 1.5–4.5)
Glucose: 144 mg/dL — ABNORMAL HIGH (ref 65–99)
POTASSIUM: 4.6 mmol/L (ref 3.5–5.2)
SODIUM: 140 mmol/L (ref 134–144)
Total Protein: 6.8 g/dL (ref 6.0–8.5)

## 2015-11-04 ENCOUNTER — Telehealth: Payer: Self-pay | Admitting: Family Medicine

## 2015-11-04 ENCOUNTER — Telehealth: Payer: Self-pay

## 2015-11-04 DIAGNOSIS — R5081 Fever presenting with conditions classified elsewhere: Secondary | ICD-10-CM | POA: Diagnosis not present

## 2015-11-04 DIAGNOSIS — R509 Fever, unspecified: Secondary | ICD-10-CM | POA: Diagnosis not present

## 2015-11-04 DIAGNOSIS — R197 Diarrhea, unspecified: Secondary | ICD-10-CM | POA: Diagnosis not present

## 2015-11-04 MED ORDER — CIPROFLOXACIN HCL 500 MG PO TABS: 500.0000 mg | ORAL_TABLET | Freq: Two times a day (BID) | ORAL | Status: DC

## 2015-11-04 NOTE — Telephone Encounter (Signed)
Returned patients call.

## 2015-11-04 NOTE — Telephone Encounter (Signed)
Pt stated he spoke with Lexine Baton about his results. Pt stated that his white blood count was elevated and that he was advised it could have been due to his colonoscopy. Pt wanted to speak with Lexine Baton again to see if maybe it could be elevated due to a virus and wanted to see if he might be contagious. Thanks TNP

## 2015-11-04 NOTE — Telephone Encounter (Signed)
-----   Message from Margo Common, [place] sent at 11/04/2015  1:48 PM EST ----- WBC count showing significant elevation and high neutrophil counts. This usually indicates infection and raises the question of possible post procedural infection. Recommend Cipro 500 mg BID #14 and notify Dr. Tiffany Kocher of blood test results (especially if having any abdominal pains). He may want a CT scan of abdomen.

## 2015-11-04 NOTE — Telephone Encounter (Signed)
Patient advised as directed below. Patient verbalized understanding. Lab results faxed to Dr. Vira Agar. Rx sent to pharmacy.

## 2015-11-15 ENCOUNTER — Telehealth: Payer: Self-pay | Admitting: Family Medicine

## 2015-11-15 NOTE — Telephone Encounter (Signed)
Pt is returning call. [phone]

## 2015-11-16 NOTE — Telephone Encounter (Signed)
LMTCB, call is regarding patient's colonoscopy and EGD results.

## 2015-11-18 NOTE — Telephone Encounter (Signed)
Patient advised of colonoscopy and EGD results.

## 2016-02-09 ENCOUNTER — Encounter: Payer: Self-pay | Admitting: Emergency Medicine

## 2016-02-09 ENCOUNTER — Emergency Department: Payer: Medicare HMO

## 2016-02-09 ENCOUNTER — Emergency Department
Admission: EM | Admit: 2016-02-09 | Discharge: 2016-02-09 | Disposition: A | Discharge status: Home / Self Care | Payer: Medicare HMO | Attending: Emergency Medicine | Admitting: Emergency Medicine

## 2016-02-09 DIAGNOSIS — Z87891 Personal history of nicotine dependence: Secondary | ICD-10-CM | POA: Insufficient documentation

## 2016-02-09 DIAGNOSIS — R1013 Epigastric pain: Secondary | ICD-10-CM | POA: Diagnosis not present

## 2016-02-09 DIAGNOSIS — R0789 Other chest pain: Secondary | ICD-10-CM | POA: Insufficient documentation

## 2016-02-09 DIAGNOSIS — Z79899 Other long term (current) drug therapy: Secondary | ICD-10-CM | POA: Insufficient documentation

## 2016-02-09 DIAGNOSIS — M1990 Unspecified osteoarthritis, unspecified site: Secondary | ICD-10-CM | POA: Insufficient documentation

## 2016-02-09 DIAGNOSIS — K8020 Calculus of gallbladder without cholecystitis without obstruction: Secondary | ICD-10-CM | POA: Insufficient documentation

## 2016-02-09 DIAGNOSIS — E785 Hyperlipidemia, unspecified: Secondary | ICD-10-CM | POA: Diagnosis not present

## 2016-02-09 DIAGNOSIS — R079 Chest pain, unspecified: Secondary | ICD-10-CM | POA: Diagnosis not present

## 2016-02-09 LAB — CBC
HCT: 41.6 % (ref 40.0–52.0)
HEMOGLOBIN: 14.3 g/dL (ref 13.0–18.0)
MCH: 32.3 pg (ref 26.0–34.0)
MCHC: 34.3 g/dL (ref 32.0–36.0)
MCV: 94 fL (ref 80.0–100.0)
Platelets: 297 10*3/uL (ref 150–440)
RBC: 4.43 MIL/uL (ref 4.40–5.90)
RDW: 13.9 % (ref 11.5–14.5)
WBC: 10.3 10*3/uL (ref 3.8–10.6)

## 2016-02-09 LAB — COMPREHENSIVE METABOLIC PANEL
ALBUMIN: 4.3 g/dL (ref 3.5–5.0)
ALK PHOS: 60 U/L (ref 38–126)
ALT: 24 U/L (ref 17–63)
ANION GAP: 10 (ref 5–15)
AST: 27 U/L (ref 15–41)
BILIRUBIN TOTAL: 0.6 mg/dL (ref 0.3–1.2)
BUN: 14 mg/dL (ref 6–20)
CALCIUM: 9.3 mg/dL (ref 8.9–10.3)
CO2: 22 mmol/L (ref 22–32)
Chloride: 108 mmol/L (ref 101–111)
Creatinine, Ser: 1.13 mg/dL (ref 0.61–1.24)
GFR calc non Af Amer: >60 mL/min (ref >60)
GLUCOSE: 118 mg/dL — AB (ref 65–99)
POTASSIUM: 3.8 mmol/L (ref 3.5–5.1)
SODIUM: 140 mmol/L (ref 135–145)
TOTAL PROTEIN: 7.3 g/dL (ref 6.5–8.1)

## 2016-02-09 LAB — LIPASE, BLOOD: Lipase: 30 U/L (ref 11–51)

## 2016-02-09 LAB — TROPONIN I

## 2016-02-09 MED ORDER — OXYCODONE-ACETAMINOPHEN 5-325 MG PO TABS: 1.0000 | ORAL_TABLET | Freq: Four times a day (QID) | ORAL | Status: DC | PRN

## 2016-02-09 MED ORDER — ONDANSETRON 4 MG PO TBDP: 4.0000 mg | ORAL_TABLET | Freq: Three times a day (TID) | ORAL | Status: DC | PRN

## 2016-02-09 MED ORDER — GI COCKTAIL ~~LOC~~
30.0000 mL | Freq: Once | ORAL | Status: DC
  Filled 2016-02-09: qty 30

## 2016-02-09 NOTE — ED Provider Notes (Signed)
[facility] Emergency Department Provider Note   ____________________________________________  Time seen: Approximately 0057 AM  I have reviewed the triage vital signs and the nursing notes.   HISTORY  Chief Complaint Chest Pain    HPI Kevin Mccullough is a 65 y.o. male who comes into the hospital today with some chest pain. He reports around 9 PM he started having some constant pain in the center of his chest. He reports that it moves slightly to the left and stayed there for some time. It is eased off since but he was concerned. He reports he's had acid reflux before but has never been this constant. He reports that currently his pain is a 2 out of 10 in intensity. He can feel a little bit in his back as well. He reports that the pain is dull. He denies any radiation denies any sweats denies nausea or vomiting and has not been lightheaded or dizzy. He was diagnosed with gallstones a few months ago and he is unsure if this has anything to do with the symptoms. The patient has not taken anything for his pain. He decided to come into the hospital to get checked out.   Past Medical History  Diagnosis Date  . Arthritis   . History of bleeding ulcers     Patient Active Problem List   Diagnosis Date Noted  . AA (alopecia areata) 03/24/2015  . Acid indigestion 03/24/2015  . H/O peptic ulcer 03/24/2015  . HLD (hyperlipidemia) 03/24/2015  . Arthritis, degenerative 03/24/2015  . Hypercholesterolemia without hypertriglyceridemia 03/24/2015    Past Surgical History  Procedure Laterality Date  . Splenectomy  1983  . Tonsillectomy  1954  . Arthroplasty Left 2003  . Back surgery  1983,1991,2010,2011    Spinal Fusion in 2011  . Shoulder surgery Left   . Inguinal hernia repair Left 06/01/2015    Procedure: HERNIA REPAIR INGUINAL ADULT;  Surgeon: Leonie Green, MD;  Location: [facility] ORS;  Service: General;  Laterality: Left;  . Hernia repair    . Joint  replacement Left     Partial Hip Replacement  . Colonoscopy with propofol N/A 10/28/2015    Procedure: COLONOSCOPY WITH PROPOFOL;  Surgeon: Manya Silvas, MD;  Location: [facility] ENDOSCOPY;  Service: Endoscopy;  Laterality: N/A;  . Esophagogastroduodenoscopy (egd) with propofol N/A 10/28/2015    Procedure: ESOPHAGOGASTRODUODENOSCOPY (EGD) WITH PROPOFOL;  Surgeon: Manya Silvas, MD;  Location: [facility] ENDOSCOPY;  Service: Endoscopy;  Laterality: N/A;    Current Outpatient Rx  Name  Route  Sig  Dispense  Refill  . BIOTIN PO   Oral   Take 1 capsule by mouth daily.         . Cholecalciferol (VITAMIN D3) 2000 UNITS TABS   Oral   Take 1 tablet by mouth daily.         . MULTIPLE VITAMINS-MINERALS PO   Oral   Take 1 tablet by mouth daily.         . OMEGA-3 FATTY ACIDS PO   Oral   Take 1 tablet by mouth daily.         . ondansetron (ZOFRAN ODT) 4 MG disintegrating tablet   Oral   Take 1 tablet (4 mg total) by mouth every 8 (eight) hours as needed for nausea or vomiting.   20 tablet   0   . oxyCODONE-acetaminophen (ROXICET) 5-325 MG tablet   Oral   Take 1 tablet by mouth every 6 (six) hours as needed.  12 tablet   0     Allergies Review of patient's allergies indicates no known allergies.  Family History  Problem Relation Age of Onset  . Liver disease Mother   . Arthritis Sister   . Diabetes Sister   . Multiple sclerosis Daughter   . Arthritis Sister   . Arthritis Sister     Social History Social History  Substance Use Topics  . Smoking status: Former Smoker -- 1.00 packs/day for 17 years    Types: Cigarettes    Quit date: 11/27/1987  . Smokeless tobacco: Never Used     Comment: QUIT IN 1989  . Alcohol Use: No    Review of Systems Constitutional: No fever/chills Eyes: No visual changes. ENT: No sore throat. Cardiovascular: chest pain. Respiratory: Denies shortness of breath. Gastrointestinal: No abdominal pain.  No nausea, no vomiting.  No diarrhea.  No  constipation. Genitourinary: Negative for dysuria. Musculoskeletal: Negative for back pain. Skin: Negative for rash. Neurological: Negative for headaches, focal weakness or numbness.  10-point ROS otherwise negative.  ____________________________________________   PHYSICAL EXAM:  VITAL SIGNS: ED Triage Vitals  Enc Vitals Group     BP 02/09/16 0014 136/80 mmHg     Pulse Rate 02/09/16 0014 71     Resp 02/09/16 0014 18     Temp 02/09/16 0014 97.9 F (36.6 C)     Temp Source 02/09/16 0014 Oral     SpO2 02/09/16 0014 95 %     Weight 02/09/16 0014 250 lb (113.399 kg)     Height 02/09/16 0014 5\' 10"  (1.778 m)     Head Cir --      Peak Flow --      Pain Score 02/09/16 0014 6     Pain Loc --      Pain Edu? --      Excl. in [place]? --     Constitutional: Alert and oriented. Well appearing and in Mild distress. Eyes: Conjunctivae are normal. PERRL. EOMI. Head: Atraumatic. Nose: No congestion/rhinnorhea. Mouth/Throat: Mucous membranes are moist.  Oropharynx non-erythematous. Cardiovascular: Normal rate, regular rhythm. Grossly normal heart sounds.  Good peripheral circulation. Respiratory: Normal respiratory effort.  No retractions. Lungs CTAB. Gastrointestinal: Soft and nontender. No distention.  Musculoskeletal: No lower extremity tenderness nor edema.   Neurologic:  Normal speech and language.  Skin:  Skin is warm, dry and intact.  Psychiatric: Mood and affect are normal.   ____________________________________________   LABS (all labs ordered are listed, but only abnormal results are displayed)  Labs Reviewed  COMPREHENSIVE METABOLIC PANEL - Abnormal; Notable for the following:    Glucose, Bld 118 (*)    All other components within normal limits  CBC  LIPASE, BLOOD  TROPONIN I  TROPONIN I   ____________________________________________  EKG  ED ECG REPORT I, Loney Hering, the attending physician, personally viewed and interpreted this ECG.   Date:  02/09/2016  EKG Time: 014  Rate: 76  Rhythm: normal sinus rhythm  Axis: normal  Intervals:none  ST&T Change: normal  ____________________________________________  RADIOLOGY  [place] abd: Cholelithiasis with non mobile stone in the gallbladder neck, no additional changes to suggest cholecystitis  CXR: Emphysematous changes in the lungs. Linear fibrosis or atelectasis in the left lung base.  ____________________________________________   PROCEDURES  Procedure(s) performed: None  Critical Care performed: No  ____________________________________________   INITIAL IMPRESSION / ASSESSMENT AND PLAN / ED COURSE  Pertinent labs & imaging results that were available during my care of the patient  were reviewed by me and considered in my medical decision making (see chart for details).  This is a 65 year old male who comes into the hospital today with some chest pain. The patient is unsure what may be causing the symptoms. Given his history of gallstones I did send the patient for an ultrasound. I ordered a GI cocktail but the patient reports that his pain is improved. At this time we are awaiting the repeat of his troponin and he will be reassessed.  The patient's pain is gone at this time and his blood work is unremarkable. The patient does appear to have a stone in his gallbladder neck but he does not have any signs of obstruction. I informed the patient that he deathly needs to have his gallbladder removed. He did not take a GI cocktail as his pain was gone at the time. The patient will be discharged home to follow-up with surgery/Dr. Burt Knack. ____________________________________________   FINAL CLINICAL IMPRESSION(S) / ED DIAGNOSES  Final diagnoses:  Epigastric pain  Chest pain, unspecified chest pain type  Gallstones      NEW MEDICATIONS STARTED DURING THIS VISIT:  Discharge Medication List as of 02/09/2016  5:32 AM    START taking these medications   Details  ondansetron  (ZOFRAN ODT) 4 MG disintegrating tablet Take 1 tablet (4 mg total) by mouth every 8 (eight) hours as needed for nausea or vomiting., Starting 02/09/2016, Until Discontinued, Print    oxyCODONE-acetaminophen (ROXICET) 5-325 MG tablet Take 1 tablet by mouth every 6 (six) hours as needed., Starting 02/09/2016, Until Discontinued, Print         Note:  This document was prepared using Dragon voice recognition software and may include unintentional dictation errors.    Loney Hering, MD 02/09/16 [phone]

## 2016-02-09 NOTE — ED Notes (Signed)
Pt in with co midsternal chest pain x 3 hours states does have hx of gallstones

## 2016-02-09 NOTE — Discharge Instructions (Signed)
Nonspecific Chest Pain  Chest pain can be caused by many different conditions. There is always a chance that your pain could be related to something serious, such as a heart attack or a blood clot in your lungs. Chest pain can also be caused by conditions that are not life-threatening. If you have chest pain, it is very important to follow up with your health care provider. CAUSES  Chest pain can be caused by:  Heartburn.  Pneumonia or bronchitis.  Anxiety or stress.  Inflammation around your heart (pericarditis) or lung (pleuritis or pleurisy).  A blood clot in your lung.  A collapsed lung (pneumothorax). It can develop suddenly on its own (spontaneous pneumothorax) or from trauma to the chest.  Shingles infection (varicella-zoster virus).  Heart attack.  Damage to the bones, muscles, and cartilage that make up your chest wall. This can include:  Bruised bones due to injury.  Strained muscles or cartilage due to frequent or repeated coughing or overwork.  Fracture to one or more ribs.  Sore cartilage due to inflammation (costochondritis). RISK FACTORS  Risk factors for chest pain may include:  Activities that increase your risk for trauma or injury to your chest.  Respiratory infections or conditions that cause frequent coughing.  Medical conditions or overeating that can cause heartburn.  Heart disease or family history of heart disease.  Conditions or health behaviors that increase your risk of developing a blood clot.  Having had chicken pox (varicella zoster). SIGNS AND SYMPTOMS Chest pain can feel like:  Burning or tingling on the surface of your chest or deep in your chest.  Crushing, pressure, aching, or squeezing pain.  Dull or sharp pain that is worse when you move, cough, or take a deep breath.  Pain that is also felt in your back, neck, shoulder, or arm, or pain that spreads to any of these areas. Your chest pain may come and go, or it may stay  constant. DIAGNOSIS Lab tests or other studies may be needed to find the cause of your pain. Your health care provider may have you take a test called an ambulatory ECG (electrocardiogram). An ECG records your heartbeat patterns at the time the test is performed. You may also have other tests, such as:  Transthoracic echocardiogram (TTE). During echocardiography, sound waves are used to create a picture of all of the heart structures and to look at how blood flows through your heart.  Transesophageal echocardiogram (TEE).This is a more advanced imaging test that obtains images from inside your body. It allows your health care provider to see your heart in finer detail.  Cardiac monitoring. This allows your health care provider to monitor your heart rate and rhythm in real time.  Holter monitor. This is a portable device that records your heartbeat and can help to diagnose abnormal heartbeats. It allows your health care provider to track your heart activity for several days, if needed.  Stress tests. These can be done through exercise or by taking medicine that makes your heart beat more quickly.  Blood tests.  Imaging tests. TREATMENT  Your treatment depends on what is causing your chest pain. Treatment may include:  Medicines. These may include:  Acid blockers for heartburn.  Anti-inflammatory medicine.  Pain medicine for inflammatory conditions.  Antibiotic medicine, if an infection is present.  Medicines to dissolve blood clots.  Medicines to treat coronary artery disease.  Supportive care for conditions that do not require medicines. This may include:  Resting.  Applying heat  or cold packs to injured areas.  Limiting activities until pain decreases. HOME CARE INSTRUCTIONS  If you were prescribed an antibiotic medicine, finish it all even if you start to feel better.  Avoid any activities that bring on chest pain.  Do not use any tobacco products, including  cigarettes, chewing tobacco, or electronic cigarettes. If you need help quitting, ask your health care provider.  Do not drink alcohol.  Take medicines only as directed by your health care provider.  Keep all follow-up visits as directed by your health care provider. This is important. This includes any further testing if your chest pain does not go away.  If heartburn is the cause for your chest pain, you may be told to keep your head raised (elevated) while sleeping. This reduces the chance that acid will go from your stomach into your esophagus.  Make lifestyle changes as directed by your health care provider. These may include:  Getting regular exercise. Ask your health care provider to suggest some activities that are safe for you.  Eating a heart-healthy diet. A registered dietitian can help you to learn healthy eating options.  Maintaining a healthy weight.  Managing diabetes, if necessary.  Reducing stress. SEEK MEDICAL CARE IF:  Your chest pain does not go away after treatment.  You have a rash with blisters on your chest.  You have a fever. SEEK IMMEDIATE MEDICAL CARE IF:   Your chest pain is worse.  You have an increasing cough, or you cough up blood.  You have severe abdominal pain.  You have severe weakness.  You faint.  You have chills.  You have sudden, unexplained chest discomfort.  You have sudden, unexplained discomfort in your arms, back, neck, or jaw.  You have shortness of breath at any time.  You suddenly start to sweat, or your skin gets clammy.  You feel nauseous or you vomit.  You suddenly feel light-headed or dizzy.  Your heart begins to beat quickly, or it feels like it is skipping beats. These symptoms may represent a serious problem that is an emergency. Do not wait to see if the symptoms will go away. Get medical help right away. Call your local emergency services (911 in the [place] Do not drive yourself to the hospital.   This  information is not intended to replace advice given to you by your health care provider. Make sure you discuss any questions you have with your health care provider.   Document Released: 05/24/2005 Document Revised: 09/04/2014 Document Reviewed: 03/20/2014 Elsevier Interactive Patient Education 2016 [employer].  Cholelithiasis Cholelithiasis (also called gallstones) is a form of gallbladder disease in which gallstones form in your gallbladder. The gallbladder is an organ that stores bile made in the liver, which helps digest fats. Gallstones begin as small crystals and slowly grow into stones. Gallstone pain occurs when the gallbladder spasms and a gallstone is blocking the duct. Pain can also occur when a stone passes out of the duct.  RISK FACTORS  Being male.   Having multiple pregnancies. Health care providers sometimes advise removing diseased gallbladders before future pregnancies.   Being obese.  Eating a diet heavy in fried foods and fat.   Being older than 64 years and increasing age.   Prolonged use of medicines containing male hormones.   Having diabetes mellitus.   Rapidly losing weight.   Having a family history of gallstones (heredity).  SYMPTOMS  Nausea.   Vomiting.  Abdominal pain.   Yellowing of the  skin (jaundice).   Sudden pain. It may persist from several minutes to several hours.  Fever.   Tenderness to the touch. In some cases, when gallstones do not move into the bile duct, people have no pain or symptoms. These are called "silent" gallstones.  TREATMENT Silent gallstones do not need treatment. In severe cases, emergency surgery may be required. Options for treatment include:  Surgery to remove the gallbladder. This is the most common treatment.  Medicines. These do not always work and may take 6-12 months or more to work.  Shock wave treatment (extracorporeal biliary lithotripsy). In this treatment an ultrasound machine  sends shock waves to the gallbladder to break gallstones into smaller pieces that can pass into the intestines or be dissolved by medicine. HOME CARE INSTRUCTIONS   Only take over-the-counter or prescription medicines for pain, discomfort, or fever as directed by your health care provider.   Follow a low-fat diet until seen again by your health care provider. Fat causes the gallbladder to contract, which can result in pain.   Follow up with your health care provider as directed. Attacks are almost always recurrent and surgery is usually required for permanent treatment.  SEEK IMMEDIATE MEDICAL CARE IF:   Your pain increases and is not controlled by medicines.   You have a fever or persistent symptoms for more than 2-3 days.   You have a fever and your symptoms suddenly get worse.   You have persistent nausea and vomiting.  MAKE SURE YOU:   Understand these instructions.  Will watch your condition.  Will get help right away if you are not doing well or get worse.   This information is not intended to replace advice given to you by your health care provider. Make sure you discuss any questions you have with your health care provider.   Document Released: 08/10/2005 Document Revised: 04/16/2013 Document Reviewed: 02/05/2013 Elsevier Interactive Patient Education [employer].

## 2016-02-09 NOTE — ED Notes (Signed)
Patient with hx of gallstones. States epigastric pain for last 3 hours. Denies nausea, vomiting, chest pain or SOB.

## 2016-02-10 ENCOUNTER — Other Ambulatory Visit: Payer: Self-pay

## 2016-02-14 ENCOUNTER — Encounter: Payer: Self-pay | Admitting: Surgery

## 2016-02-14 ENCOUNTER — Ambulatory Visit (INDEPENDENT_AMBULATORY_CARE_PROVIDER_SITE_OTHER): Payer: Medicare HMO | Admitting: Surgery

## 2016-02-14 VITALS — BP 127/82 | HR 70 | Temp 97.7°F | Ht 68.0 in | Wt 267.0 lb

## 2016-02-14 DIAGNOSIS — K8020 Calculus of gallbladder without cholecystitis without obstruction: Secondary | ICD-10-CM | POA: Diagnosis not present

## 2016-02-14 NOTE — Patient Instructions (Addendum)
Please call our office if you have any questions or concerns. Please see your appointment listed below.

## 2016-02-14 NOTE — Progress Notes (Signed)
Kevin Mccullough is an 65 y.o. male.   Chief Complaint: gallstones HPI: Is patient with known gallstones who was in the emergency room a few days ago with left shoulder pain and epigastric pain. He denies any right upper quadrant pain that is not related to fatty food intolerance had no loose stools and only mild nausea and no emesis this episode relieved itself proximally 4 hours after it started spontaneously. He had an episode like this several months ago that lasted 1 hour's so those are only 2 episodes in which this occurred. Neither were related to fatty foods. He reiterates that this was in his left shoulder and radiated to his left upper quadrant and epigastrium.  Of significance he has had a splenectomy for trauma He also has a significant history of peptic ulcer disease with bleeding ulcer requiring hospitalization but no surgery. He has been told he has Barrett's esophagus and had an EGD recently No family history of gallstones He is retired from [employer] does not smoke or drink  Past Medical History  Diagnosis Date  . Arthritis   . History of bleeding ulcers     Past Surgical History  Procedure Laterality Date  . Splenectomy  1983  . Tonsillectomy  1954  . Arthroplasty Left 2003  . Back surgery  1983,1991,2010,2011    Spinal Fusion in 2011  . Shoulder surgery Left   . Inguinal hernia repair Left 06/01/2015    Procedure: HERNIA REPAIR INGUINAL ADULT;  Surgeon: Leonie Green, MD;  Location: [facility] ORS;  Service: General;  Laterality: Left;  . Hernia repair    . Joint replacement Left     Partial Hip Replacement  . Colonoscopy with propofol N/A 10/28/2015    Procedure: COLONOSCOPY WITH PROPOFOL;  Surgeon: Manya Silvas, MD;  Location: [facility] ENDOSCOPY;  Service: Endoscopy;  Laterality: N/A;  . Esophagogastroduodenoscopy (egd) with propofol N/A 10/28/2015    Procedure: ESOPHAGOGASTRODUODENOSCOPY (EGD) WITH PROPOFOL;  Surgeon: Manya Silvas, MD;  Location: [facility] ENDOSCOPY;   Service: Endoscopy;  Laterality: N/A;    Family History  Problem Relation Age of Onset  . Liver disease Mother   . Arthritis Sister   . Diabetes Sister   . Multiple sclerosis Daughter   . Arthritis Sister   . Arthritis Sister    Social History:  reports that he quit smoking about 28 years ago. His smoking use included Cigarettes. He has a 17 pack-year smoking history. He has never used smokeless tobacco. He reports that he does not drink alcohol or use illicit drugs.  Allergies: No Known Allergies   (Not in a hospital admission)   Review of Systems:   Review of Systems  Constitutional: Negative for fever and chills.  HENT: Negative.   Eyes: Negative.   Respiratory: Negative.   Cardiovascular: Negative.   Gastrointestinal: Positive for nausea and abdominal pain. Negative for heartburn, vomiting, diarrhea, constipation, blood in stool and melena.  Genitourinary: Negative.   Musculoskeletal: Negative.   Skin: Negative.   Neurological: Negative.   Endo/Heme/Allergies: Negative.   Psychiatric/Behavioral: Negative.     Physical Exam:  Physical Exam  Constitutional: He is oriented to person, place, and time and well-developed, well-nourished, and in no distress. No distress.  HENT:  Head: Normocephalic.  Mouth/Throat: No oropharyngeal exudate.  Eyes: Right eye exhibits no discharge. Left eye exhibits no discharge. No scleral icterus.  Neck: Normal range of motion.  Cardiovascular: Normal rate, regular rhythm and normal heart sounds.   Pulmonary/Chest: Effort normal and breath  sounds normal. No respiratory distress. He has no wheezes. He has no rales.  Abdominal: Soft. He exhibits no distension. There is no tenderness. There is no rebound and no guarding.  Midline scar from xiphoid to infraumbilical  Musculoskeletal: Normal range of motion. He exhibits no edema.  Lymphadenopathy:    He has no cervical adenopathy.  Neurological: He is alert and oriented to person, place,  and time.  Skin: Skin is warm and dry. No rash noted. He is not diaphoretic. No erythema.  Psychiatric: Mood and affect normal.  Vitals reviewed.   Blood pressure 127/82, pulse 70, temperature 97.7 F (36.5 C), temperature source Oral, height 5\' 8"  (1.727 m), weight 267 lb (121.11 kg).    No results found for this or any previous visit (from the past 48 hour(s)). No results found.   Assessment/Plan Gallstones on ultrasound performed in the ED at the time of his attack. No sign of acute cholecystitis. LFTs were normal. The patient's symptoms do not necessarily speak towards gallbladder disease although it is quite possible but he is only had 2 episodes both of them rather short-lived and not related to fatty food intolerance. With that in mind I would not recommend nor expose him to the risks of surgery especially with his long midline incision that may necessitate an open procedure should extensive extensive scar tissue become encountered. He was understanding of this plan and will follow up in 1 month to review symptoms.  Florene Glen, MD, FACS

## 2016-03-20 ENCOUNTER — Ambulatory Visit: Payer: Self-pay | Admitting: Surgery

## 2016-06-30 ENCOUNTER — Ambulatory Visit (INDEPENDENT_AMBULATORY_CARE_PROVIDER_SITE_OTHER): Payer: Medicare HMO

## 2016-06-30 DIAGNOSIS — Z23 Encounter for immunization: Secondary | ICD-10-CM | POA: Diagnosis not present

## 2016-06-30 NOTE — Addendum Note (Signed)
Addended by: Arnette Norris on: 06/30/2016 10:36 AM   Modules accepted: Orders

## 2016-08-11 ENCOUNTER — Ambulatory Visit (INDEPENDENT_AMBULATORY_CARE_PROVIDER_SITE_OTHER): Payer: Medicare HMO | Admitting: Family Medicine

## 2016-08-11 VITALS — BP 110/72 | HR 80 | Temp 98.2°F | Resp 14 | Wt 251.0 lb

## 2016-08-11 DIAGNOSIS — R1011 Right upper quadrant pain: Secondary | ICD-10-CM | POA: Diagnosis not present

## 2016-08-11 DIAGNOSIS — Z8719 Personal history of other diseases of the digestive system: Secondary | ICD-10-CM

## 2016-08-11 NOTE — Progress Notes (Signed)
Patient: Kevin Mccullough Male    DOB: 1951/07/26   65 y.o.   MRN: [id number] Visit Date: 08/11/2016  Today's Provider: Vernie Murders, PA   Chief Complaint  Patient presents with  . Abdominal Pain   Subjective:    HPI  Patient is here to discuss right side abdominal pain he has had this week (RUQ at ribs). First episode was Monday and lasted a few hours and then yesterday 08/10/16 for a few hours. Pain does not radiate, pain described as constant and achy when he has it. Taking a deep breath makes it worse. He has felt constipated for the past week or so, he usually has 1 bowel movement daily but has noticed lately harder stools. No blood in the stools. No nausea, no vomiting, no heartburn. He has been taking Garcenia supplements for about 2 months.   He mentions that he went to ER on 02/09/16 and was told he does have a gallstone but when the surgeon looked and spoke to patient he was advised there is no need to take it out at that time.   Past Medical History:  Diagnosis Date  . Arthritis   . History of bleeding ulcers    Patient Active Problem List   Diagnosis Date Noted  . AA (alopecia areata) 03/24/2015  . Acid indigestion 03/24/2015  . H/O peptic ulcer 03/24/2015  . HLD (hyperlipidemia) 03/24/2015  . Arthritis, degenerative 03/24/2015  . Hypercholesterolemia without hypertriglyceridemia 03/24/2015   Past Surgical History:  Procedure Laterality Date  . ARTHROPLASTY Left 2003  . BACK SURGERY  1983,1991,2010,2011   Spinal Fusion in 2011  . COLONOSCOPY WITH PROPOFOL N/A 10/28/2015   Procedure: COLONOSCOPY WITH PROPOFOL;  Surgeon: Manya Silvas, MD;  Location[facility] ENDOSCOPY;  Service: Endoscopy;  Laterality: N/A;  . ESOPHAGOGASTRODUODENOSCOPY (EGD) WITH PROPOFOL N/A 10/28/2015   Procedure: ESOPHAGOGASTRODUODENOSCOPY (EGD) WITH PROPOFOL;  Surgeon: Manya Silvas, MD;  Location: [facility] ENDOSCOPY;  Service: Endoscopy;  Laterality: N/A;  . HERNIA REPAIR    . INGUINAL  HERNIA REPAIR Left 06/01/2015   Procedure: HERNIA REPAIR INGUINAL ADULT;  Surgeon: Leonie Green, MD;  Location: [facility] ORS;  Service: General;  Laterality: Left;  . JOINT REPLACEMENT Left    Partial Hip Replacement  . SHOULDER SURGERY Left   . SPLENECTOMY  1983  . TONSILLECTOMY  1954     Family History  Problem Relation Age of Onset  . Liver disease Mother   . Arthritis Sister   . Diabetes Sister   . Multiple sclerosis Daughter   . Arthritis Sister   . Arthritis Sister    No Known Allergies  Current Outpatient Prescriptions:  .  aspirin EC 81 MG tablet, Take 81 mg by mouth daily., Disp: , Rfl:  .  Biotin 10 MG CAPS, Take by mouth daily., Disp: , Rfl:  .  Cholecalciferol (VITAMIN D3) 2000 UNITS TABS, Take 1 tablet by mouth daily., Disp: , Rfl:  .  GARCINIA CAMBOGIA-CHROMIUM PO, Take by mouth daily., Disp: , Rfl:  .  MULTIPLE VITAMINS-MINERALS PO, Take 1 tablet by mouth daily., Disp: , Rfl:  .  OMEGA-3 FATTY ACIDS PO, Take 1 tablet by mouth daily., Disp: , Rfl:   Review of Systems  Constitutional: Negative.   Respiratory: Negative.   Gastrointestinal: Positive for abdominal pain and constipation. Negative for abdominal distention, anal bleeding, blood in stool, nausea, rectal pain and vomiting.  Musculoskeletal: Negative.     Social History  Substance Use Topics  .  Smoking status: Former Smoker    Packs/day: 1.00    Years: 17.00    Types: Cigarettes    Quit date: 11/27/1987  . Smokeless tobacco: Never Used     Comment: QUIT IN 1989  . Alcohol use No   Objective:   BP 110/72   Pulse 80   Temp 98.2 F (36.8 C)   Resp 14   Wt 251 lb (113.9 kg)   BMI 38.16 kg/m   Physical Exam  Constitutional: He is oriented to person, place, and time. He appears well-developed and well-nourished. No distress.  HENT:  Head: Normocephalic and atraumatic.  Right Ear: Hearing normal.  Left Ear: Hearing normal.  Nose: Nose normal.  Eyes: Conjunctivae and lids are normal. Right  eye exhibits no discharge. Left eye exhibits no discharge. No scleral icterus.  Cardiovascular: Normal rate and regular rhythm.   Pulmonary/Chest: Effort normal and breath sounds normal. No respiratory distress.  Abdominal: Soft. Bowel sounds are normal. There is no tenderness.  Musculoskeletal: Normal range of motion.  Neurological: He is alert and oriented to person, place, and time.  Skin: Skin is intact. No lesion and no rash noted.  Psychiatric: He has a normal mood and affect. His speech is normal and behavior is normal. Thought content normal.      Assessment & Plan:     1. RUQ abdominal pain Onset over the past 3-4 days. No fever, nausea or diarrhea. Denies significant flatulence or eructations. Urinalysis normal - no signs of UTI or renal stones. Will get labs and abdominal ultrasound. Recommend low fat and increased fluids in diet. Recheck pending lab reports and [place] report. - CBC with Differential/Platelet - Comprehensive metabolic panel - Lipase - POCT urinalysis dipstick - US Abdomen Limited RUQ  2. Hx of cholelithiasis Ultrasound on 02-09-16 in the [facility] ED, showed a 1.7 cm gallstone. Asymptomatic by surgical evaluation on 02-14-16 and decided to monitor. Has been working on weight loss with use of Garcenia the past 2 months. Will recheck ultrasound and labs to rule out pancreatitis versus progression of gallbladder disease. - CBC with Differential/Platelet - Comprehensive metabolic panel - US Abdomen Limited RUQ       Vernie Murders, PA  [facility]

## 2016-08-11 NOTE — Patient Instructions (Signed)

## 2016-08-12 LAB — CBC WITH DIFFERENTIAL/PLATELET
BASOS: 1 %
Basophils Absolute: 0.1 10*3/uL (ref 0.0–0.2)
EOS (ABSOLUTE): 0.2 10*3/uL (ref 0.0–0.4)
EOS: 3 %
HEMATOCRIT: 42.6 % (ref 37.5–51.0)
HEMOGLOBIN: 15.3 g/dL (ref 13.0–17.7)
Immature Grans (Abs): 0 10*3/uL (ref 0.0–0.1)
Immature Granulocytes: 0 %
LYMPHS ABS: 3.6 10*3/uL — AB (ref 0.7–3.1)
Lymphs: 41 %
MCH: 32.8 pg (ref 26.6–33.0)
MCHC: 35.9 g/dL — AB (ref 31.5–35.7)
MCV: 91 fL (ref 79–97)
MONOCYTES: 10 %
Monocytes Absolute: 0.9 10*3/uL (ref 0.1–0.9)
Neutrophils Absolute: 4.1 10*3/uL (ref 1.4–7.0)
Neutrophils: 45 %
Platelets: 325 10*3/uL (ref 150–379)
RBC: 4.66 x10E6/uL (ref 4.14–5.80)
RDW: 14.4 % (ref 12.3–15.4)
WBC: 8.9 10*3/uL (ref 3.4–10.8)

## 2016-08-12 LAB — COMPREHENSIVE METABOLIC PANEL
ALBUMIN: 4.5 g/dL (ref 3.6–4.8)
ALK PHOS: 68 IU/L (ref 39–117)
ALT: 26 IU/L (ref 0–44)
AST: 27 IU/L (ref 0–40)
Albumin/Globulin Ratio: 1.6 (ref 1.2–2.2)
BUN / CREAT RATIO: 7 — AB (ref 10–24)
BUN: 9 mg/dL (ref 8–27)
Bilirubin Total: 0.9 mg/dL (ref 0.0–1.2)
CALCIUM: 10.2 mg/dL (ref 8.6–10.2)
CO2: 26 mmol/L (ref 18–29)
CREATININE: 1.22 mg/dL (ref 0.76–1.27)
Chloride: 104 mmol/L (ref 96–106)
GFR calc Af Amer: 71 mL/min/{1.73_m2} (ref >59)
GFR, EST NON AFRICAN AMERICAN: 61 mL/min/{1.73_m2} (ref >59)
GLOBULIN, TOTAL: 2.8 g/dL (ref 1.5–4.5)
Glucose: 94 mg/dL (ref 65–99)
Potassium: 4.6 mmol/L (ref 3.5–5.2)
SODIUM: 145 mmol/L — AB (ref 134–144)
Total Protein: 7.3 g/dL (ref 6.0–8.5)

## 2016-08-12 LAB — LIPASE: Lipase: 30 U/L (ref 13–78)

## 2016-08-14 ENCOUNTER — Telehealth: Payer: Self-pay

## 2016-08-14 ENCOUNTER — Encounter: Payer: Self-pay | Admitting: Family Medicine

## 2016-08-14 LAB — POCT URINALYSIS DIPSTICK
BILIRUBIN UA: NEGATIVE
Blood, UA: NEGATIVE
Glucose, UA: NEGATIVE
Ketones, UA: NEGATIVE
Leukocytes, UA: NEGATIVE
Nitrite, UA: NEGATIVE
Protein, UA: NEGATIVE
SPEC GRAV UA: 1.01
UROBILINOGEN UA: 0.2
pH, UA: 6

## 2016-08-14 NOTE — Telephone Encounter (Signed)
-----   Message from [place]o [place], [place] sent at 08/12/2016 12:36 PM EST ----- Normal blood cell count, liver enzymes and lipase level. Proceed with ultrasound test of gallbladder. Increase water intake with low fat diet.

## 2016-08-14 NOTE — Telephone Encounter (Signed)
Patient advised.

## 2016-08-16 ENCOUNTER — Ambulatory Visit
Admission: RE | Admit: 2016-08-16 | Discharge: 2016-08-16 | Disposition: A | Discharge status: Home / Self Care | Payer: Medicare HMO | Source: Ambulatory Visit | Attending: Family Medicine | Admitting: Family Medicine

## 2016-08-16 DIAGNOSIS — Z8719 Personal history of other diseases of the digestive system: Secondary | ICD-10-CM | POA: Diagnosis not present

## 2016-08-16 DIAGNOSIS — K769 Liver disease, unspecified: Secondary | ICD-10-CM | POA: Diagnosis not present

## 2016-08-16 DIAGNOSIS — R1011 Right upper quadrant pain: Secondary | ICD-10-CM | POA: Diagnosis not present

## 2016-08-16 DIAGNOSIS — K8020 Calculus of gallbladder without cholecystitis without obstruction: Secondary | ICD-10-CM | POA: Diagnosis not present

## 2016-08-17 ENCOUNTER — Telehealth: Payer: Self-pay

## 2016-08-17 DIAGNOSIS — Z8719 Personal history of other diseases of the digestive system: Secondary | ICD-10-CM

## 2016-08-17 DIAGNOSIS — R1011 Right upper quadrant pain: Secondary | ICD-10-CM

## 2016-08-17 NOTE — Telephone Encounter (Signed)
-----   Message from [place], [place] sent at 08/16/2016 10:58 PM EST ----- Ultrasound confirms gallstone in the neck of the gallbladder. Recommend evaluation by surgeon. Will schedule when he is ready.

## 2016-08-17 NOTE — Telephone Encounter (Signed)
Patient verbalizes understanding and is in agreement with treatment plan. Patient advised as below.

## 2016-08-17 NOTE — Telephone Encounter (Signed)
NA. Will try later on. sd

## 2016-08-29 ENCOUNTER — Encounter: Payer: Self-pay | Admitting: *Deleted

## 2016-09-04 ENCOUNTER — Ambulatory Visit: Payer: Self-pay | Admitting: General Surgery

## 2016-09-06 ENCOUNTER — Encounter: Payer: Self-pay | Admitting: General Surgery

## 2016-09-06 ENCOUNTER — Ambulatory Visit (INDEPENDENT_AMBULATORY_CARE_PROVIDER_SITE_OTHER): Payer: Medicare HMO | Admitting: General Surgery

## 2016-09-06 VITALS — BP 124/78 | HR 80 | Resp 16 | Ht 68.0 in | Wt 251.0 lb

## 2016-09-06 DIAGNOSIS — K8020 Calculus of gallbladder without cholecystitis without obstruction: Secondary | ICD-10-CM

## 2016-09-06 NOTE — Patient Instructions (Addendum)
The patient is aware to call back for any questions or concerns.  Laparoscopic Cholecystectomy Laparoscopic cholecystectomy is surgery to remove the gallbladder. The gallbladder is a pear-shaped organ that lies beneath the liver on the right side of the body. The gallbladder stores bile, which is a fluid that helps the body to digest fats. Cholecystectomy is often done for inflammation of the gallbladder (cholecystitis). This condition is usually caused by a buildup of gallstones (cholelithiasis) in the gallbladder. Gallstones can block the flow of bile, which can result in inflammation and pain. In severe cases, emergency surgery may be required. This procedure is done though small incisions in your abdomen (laparoscopic surgery). A thin scope with a camera (laparoscope) is inserted through one incision. Thin surgical instruments are inserted through the other incisions. In some cases, a laparoscopic procedure may be turned into a type of surgery that is done through a larger incision (open surgery). Tell a health care provider about:  Any allergies you have.  All medicines you are taking, including vitamins, herbs, eye drops, creams, and over-the-counter medicines.  Any problems you or family members have had with anesthetic medicines.  Any blood disorders you have.  Any surgeries you have had.  Any medical conditions you have.  Whether you are pregnant or may be pregnant. What are the risks? Generally, this is a safe procedure. However, problems may occur, including:  Infection.  Bleeding.  Allergic reactions to medicines.  Damage to other structures or organs.  A stone remaining in the common bile duct. The common bile duct carries bile from the gallbladder into the small intestine.  A bile leak from the cyst duct that is clipped when your gallbladder is removed. What happens before the procedure? Staying hydrated  Follow instructions from your health care provider about  hydration, which may include:  Up to 2 hours before the procedure - you may continue to drink clear liquids, such as water, clear fruit juice, black coffee, and plain tea. Eating and drinking restrictions  Follow instructions from your health care provider about eating and drinking, which may include:  8 hours before the procedure - stop eating heavy meals or foods such as meat, fried foods, or fatty foods.  6 hours before the procedure - stop eating light meals or foods, such as toast or cereal.  6 hours before the procedure - stop drinking milk or drinks that contain milk.  2 hours before the procedure - stop drinking clear liquids. Medicines   Ask your health care provider about:  Changing or stopping your regular medicines. This is especially important if you are taking diabetes medicines or blood thinners.  Taking medicines such as aspirin and ibuprofen. These medicines can thin your blood. Do not take these medicines before your procedure if your health care provider instructs you not to.  You may be given antibiotic medicine to help prevent infection. General instructions   Let your health care provider know if you develop a cold or an infection before surgery.  Plan to have someone take you home from the hospital or clinic.  Ask your health care provider how your surgical site will be marked or identified. What happens during the procedure?  To reduce your risk of infection:  Your health care team will wash or sanitize their hands.  Your skin will be washed with soap.  Hair may be removed from the surgical area.  An IV tube may be inserted into one of your veins.  You will be given   one or more of the following:  A medicine to help you relax (sedative).  A medicine to make you fall asleep (general anesthetic).  A breathing tube will be placed in your mouth.  Your surgeon will make several small cuts (incisions) in your abdomen.  The laparoscope will be  inserted through one of the small incisions. The camera on the laparoscope will send images to a TV screen (monitor) in the operating room. This lets your surgeon see inside your abdomen.  Air-like gas will be pumped into your abdomen. This will expand your abdomen to give the surgeon more room to perform the surgery.  Other tools that are needed for the procedure will be inserted through the other incisions. The gallbladder will be removed through one of the incisions.  Your common bile duct may be examined. If stones are found in the common bile duct, they may be removed.  After your gallbladder has been removed, the incisions will be closed with stitches (sutures), staples, or skin glue.  Your incisions may be covered with a bandage (dressing). The procedure may vary among health care providers and hospitals. What happens after the procedure?  Your blood pressure, heart rate, breathing rate, and blood oxygen level will be monitored until the medicines you were given have worn off.  You will be given medicines as needed to control your pain.  Do not drive for 24 hours if you were given a sedative. This information is not intended to replace advice given to you by your health care provider. Make sure you discuss any questions you have with your health care provider. Document Released: 08/14/2005 Document Revised: 03/05/2016 Document Reviewed: 01/31/2016 Elsevier Interactive Patient Education  2017 [employer]   

## 2016-09-06 NOTE — Progress Notes (Signed)
Patient ID: Kevin Mccullough, male   DOB: Jul 15, 1951, 66 y.o.   MRN: [id number]  Chief Complaint  Patient presents with  . Other    HPI Kevin Mccullough is a 66 y.o. male here today for a evaluation of gallstones. He originally went to the ED for chest pain and they told him then he had a gall stone.This was an incidental finding on review of the emergency department notes, and was not thought to be the source of his chest discomfort. By the patient's report no final diagnosis was rendered, but no cardiac source was appreciated.  He saw Dr. Phoebe Perch in June, 2017 following up from a hospital visit on 02/09/2016. That time he had had no true abdominal pain, and surgical intervention was not recommended. He did have some right side abdominal pain around the second week in December. Pain does not radiate, pain described as constant and ache for 2 days when most severe. More recently, he has had shorter episodes of discomfort. No nocturnal symptoms.. He describes the pain as more of a pressure now and worse when he takes a deep breath. He states the pain last about 5 minutes and has gotten worse over the past few days. Not associated with foods. He does admit to mild constipation. Patient states he had a ultrasound done on 08/16/2016.    HPI  Past Medical History:  Diagnosis Date  . Arthritis   . History of bleeding ulcers     Past Surgical History:  Procedure Laterality Date  . ARTHROPLASTY Left 2003  . BACK SURGERY  1983,1991,2010,2011   Spinal Fusion in 2011  . COLONOSCOPY WITH PROPOFOL N/A 10/28/2015   Procedure: COLONOSCOPY WITH PROPOFOL;  Surgeon: Manya Silvas, MD;  Location: [facility] ENDOSCOPY;  Service: Endoscopy;  Laterality: N/A;  . ESOPHAGOGASTRODUODENOSCOPY (EGD) WITH PROPOFOL N/A 10/28/2015   Procedure: ESOPHAGOGASTRODUODENOSCOPY (EGD) WITH PROPOFOL;  Surgeon: Manya Silvas, MD;  Location: [facility] ENDOSCOPY;  Service: Endoscopy;  Laterality: N/A;  . HERNIA REPAIR    . INGUINAL  HERNIA REPAIR Left 06/01/2015   Procedure: HERNIA REPAIR INGUINAL ADULT;  Surgeon: Leonie Green, MD;  Location: [facility] ORS;  Service: General;  Laterality: Left;  . JOINT REPLACEMENT Left 2003   Partial Hip Replacement  . SHOULDER SURGERY Left   . SPLENECTOMY  1983  . TONSILLECTOMY  1954    Family History  Problem Relation Age of Onset  . Liver disease Mother   . Arthritis Sister   . Diabetes Sister   . Multiple sclerosis Daughter   . Arthritis Sister   . Arthritis Sister     Social History Social History  Substance Use Topics  . Smoking status: Former Smoker    Packs/day: 1.00    Years: 17.00    Types: Cigarettes    Quit date: 11/27/1987  . Smokeless tobacco: Never Used     Comment: QUIT IN 1989  . Alcohol use No    No Known Allergies  Current Outpatient Prescriptions  Medication Sig Dispense Refill  . aspirin EC 81 MG tablet Take 81 mg by mouth daily.    . Biotin 10 MG CAPS Take by mouth daily.    . Cholecalciferol (VITAMIN D3) 2000 UNITS TABS Take 1 tablet by mouth daily.    Marland Kitchen GARCINIA CAMBOGIA-CHROMIUM PO Take by mouth daily.    . MULTIPLE VITAMINS-MINERALS PO Take 1 tablet by mouth daily.    . OMEGA-3 FATTY ACIDS PO Take 1 tablet by mouth daily.  No current facility-administered medications for this visit.     Review of Systems Review of Systems  Constitutional: Negative.   Respiratory: Negative.   Cardiovascular: Negative.   Gastrointestinal: Positive for abdominal pain. Negative for constipation, diarrhea and nausea.    Blood pressure 124/78, pulse 80, resp. rate 16, height 5\' 8"  (1.727 m), weight 251 lb (113.9 kg).  Physical Exam Physical Exam  Constitutional: He is oriented to person, place, and time. He appears well-developed and well-nourished.  HENT:  Mouth/Throat: Oropharynx is clear and moist.  Eyes: Conjunctivae are normal. No scleral icterus.  Neck: Neck supple.  Cardiovascular: Normal rate, regular rhythm and normal heart sounds.    Pulmonary/Chest: Effort normal and breath sounds normal.  Abdominal: Soft. Normal appearance and bowel sounds are normal. There is no tenderness. No hernia.    Upper midline abdominal scar.  Lymphadenopathy:    He has no cervical adenopathy.  Neurological: He is alert and oriented to person, place, and time.  Skin: Skin is warm and dry.  Psychiatric: His behavior is normal.    Data Reviewed Laboratory studies from December 2017 showed a scant elevation of the serum sodium of 145, normal renal function with an estimated GFR of 61 and a creatinine of 1.2. Normal liver function studies. Lipase was normal in December 2017. CBC of the same date showed a hemoglobin of 15.3 with an MCV of 91, white blood cell count 8900 area of note at the time of his presentation to the ED his white blood cell count was 10,300, but 3 months earlier in the seated presented with a white blood cell count 21,000. Sample obtained within 24 hours of his upper and lower endoscopy with a temperature maximum of 101.9. Follow-up within 24 hours showed this had returned and normal. 10/28/2015 colonoscopy reported 3 diminutive polyps in the sigmoid and descending colon. Removed. Hyperplastic polyps.  10/28/2015 upper endoscopy was normal. Biopsy of the distal esophagus showed reflux esophagitis with mild intestinal metaplasia.   Assessment    Symptomatic cholelithiasis.    Plan         Laparoscopic Cholecystectomy with Intraoperative Cholangiogram. The procedure, including it's potential risks and complications (including but not limited to infection, bleeding, injury to intra-abdominal organs or bile ducts, bile leak, poor cosmetic result, sepsis and death) were discussed with the patient in detail. Non-operative options, including their inherent risks (acute calculous cholecystitis with possible choledocholithiasis or gallstone pancreatitis, with the risk of ascending cholangitis, sepsis, and death) were discussed  as well. The patient expressed and understanding of what we discussed and wishes to proceed with laparoscopic cholecystectomy. The patient further understands that if it is technically not possible, or it is unsafe to proceed laparoscopically, that I will convert to an open cholecystectomy.  Patient's surgery has been scheduled for 09-21-16 at [facility]. It is okay for patient to continue 81 mg aspirin once daily.   This information has been scribed by Karie Fetch RN, BSN,BC.  Kevin Mccullough 09/06/2016, 9:07 PM

## 2016-09-13 ENCOUNTER — Encounter
Admission: RE | Admit: 2016-09-13 | Discharge: 2016-09-13 | Disposition: A | Discharge status: Home / Self Care | Payer: BLUE CROSS/BLUE SHIELD | Source: Ambulatory Visit | Attending: General Surgery | Admitting: General Surgery

## 2016-09-13 HISTORY — DX: Adverse effect of unspecified anesthetic, initial encounter: T41.45XA

## 2016-09-13 HISTORY — DX: Inflammatory liver disease, unspecified: K75.9

## 2016-09-13 HISTORY — DX: Other complications of anesthesia, initial encounter: T88.59XA

## 2016-09-13 HISTORY — DX: Personal history of urinary calculi: Z87.442

## 2016-09-13 NOTE — Patient Instructions (Signed)
  Your procedure is scheduled on: 09-21-16  Report to Same Day Surgery 2nd floor medical mall [facility] Entrance-take elevator on left to 2nd floor.  Check in with surgery information desk.) To find out your arrival time please call [phone] between 1PM - 3PM on 09-20-16  Remember: Instructions that are not followed completely may result in serious medical risk, up to and including death, or upon the discretion of your surgeon and anesthesiologist your surgery may need to be rescheduled.    _x___ 1. Do not eat food or drink liquids after midnight. No gum chewing or hard candies.     __x__ 2. No Alcohol for 24 hours before or after surgery.   __x__3. No Smoking for 24 prior to surgery.   ____  4. Bring all medications with you on the day of surgery if instructed.    __x__ 5. Notify your doctor if there is any change in your medical condition     (cold, fever, infections).     Do not wear jewelry, make-up, hairpins, clips or nail polish.  Do not wear lotions, powders, or perfumes. You may wear deodorant.  Do not shave 48 hours prior to surgery. Men may shave face and neck.  Do not bring valuables to the hospital.    [facility] is not responsible for any belongings or valuables.               Contacts, dentures or bridgework may not be worn into surgery.  Leave your suitcase in the car. After surgery it may be brought to your room.  For patients admitted to the hospital, discharge time is determined by your treatment team.   Patients discharged the day of surgery will not be allowed to drive home.  You will need someone to drive you home and stay with you the night of your procedure.    Please read over the following fact sheets that you were given:   [facility] Preparing for Surgery and or MRSA Information   ____ Take these medicines the morning of surgery with A SIP OF WATER:    1. NONE  2.  3.  4.  5.  6.  ____Fleets enema or Magnesium Citrate as directed.   ____  Use CHG Soap or sage wipes as directed on instruction sheet   ____ Use inhalers on the day of surgery and bring to hospital day of surgery  ____ Stop metformin 2 days prior to surgery    ____ Take 1/2 of usual insulin dose the night before surgery and none on the morning of surgery.   ____ Stop Aspirin, Coumadin, Pllavix ,Eliquis, Effient, or Pradaxa-OK TO CONTINUE 81 MG ASA-DO NOT TAKE AM OF SURGERY  __ Stop Anti-inflammatories such as Advil, Aleve, Ibuprofen, Motrin, Naproxen,          Naprosyn, Goodies powders or aspirin products. Ok to take Tylenol.   _X___ Stop supplements until after surgery-STOP BIOTIN, OMEGA 3 AND GARCINIA CAMBOGIA NOW  ____ Bring C-Pap to the hospital.

## 2016-09-21 ENCOUNTER — Encounter: Payer: Self-pay | Admitting: *Deleted

## 2016-09-21 ENCOUNTER — Ambulatory Visit: Payer: Medicare HMO | Admitting: Anesthesiology

## 2016-09-21 ENCOUNTER — Ambulatory Visit: Payer: Medicare HMO

## 2016-09-21 ENCOUNTER — Encounter
Admission: RE | Disposition: A | Discharge status: Home / Self Care | Payer: Self-pay | Source: Ambulatory Visit | Attending: General Surgery

## 2016-09-21 ENCOUNTER — Observation Stay
Admission: RE | Admit: 2016-09-21 | Discharge: 2016-09-23 | Disposition: A | Discharge status: Home / Self Care | Payer: Medicare HMO | Source: Ambulatory Visit | Attending: General Surgery | Admitting: General Surgery

## 2016-09-21 DIAGNOSIS — K8064 Calculus of gallbladder and bile duct with chronic cholecystitis without obstruction: Principal | ICD-10-CM | POA: Insufficient documentation

## 2016-09-21 DIAGNOSIS — K660 Peritoneal adhesions (postprocedural) (postinfection): Secondary | ICD-10-CM | POA: Diagnosis not present

## 2016-09-21 DIAGNOSIS — Z87891 Personal history of nicotine dependence: Secondary | ICD-10-CM | POA: Insufficient documentation

## 2016-09-21 DIAGNOSIS — E669 Obesity, unspecified: Secondary | ICD-10-CM | POA: Diagnosis not present

## 2016-09-21 DIAGNOSIS — Z9081 Acquired absence of spleen: Secondary | ICD-10-CM | POA: Insufficient documentation

## 2016-09-21 DIAGNOSIS — K8010 Calculus of gallbladder with chronic cholecystitis without obstruction: Secondary | ICD-10-CM | POA: Diagnosis not present

## 2016-09-21 DIAGNOSIS — K8020 Calculus of gallbladder without cholecystitis without obstruction: Secondary | ICD-10-CM

## 2016-09-21 DIAGNOSIS — Z6838 Body mass index (BMI) 38.0-38.9, adult: Secondary | ICD-10-CM | POA: Insufficient documentation

## 2016-09-21 DIAGNOSIS — K8050 Calculus of bile duct without cholangitis or cholecystitis without obstruction: Secondary | ICD-10-CM | POA: Diagnosis present

## 2016-09-21 DIAGNOSIS — K759 Inflammatory liver disease, unspecified: Secondary | ICD-10-CM | POA: Insufficient documentation

## 2016-09-21 DIAGNOSIS — Z7982 Long term (current) use of aspirin: Secondary | ICD-10-CM | POA: Diagnosis not present

## 2016-09-21 DIAGNOSIS — K8044 Calculus of bile duct with chronic cholecystitis without obstruction: Secondary | ICD-10-CM | POA: Diagnosis not present

## 2016-09-21 HISTORY — PX: CHOLECYSTECTOMY: SHX55

## 2016-09-21 SURGERY — LAPAROSCOPIC CHOLECYSTECTOMY WITH INTRAOPERATIVE CHOLANGIOGRAM
Anesthesia: General | Wound class: Clean Contaminated

## 2016-09-21 MED ORDER — FAMOTIDINE 20 MG PO TABS
20.0000 mg | ORAL_TABLET | Freq: Once | ORAL | Status: AC
  Administered 2016-09-21: 20 mg via ORAL

## 2016-09-21 MED ORDER — MIDAZOLAM HCL 2 MG/2ML IJ SOLN
INTRAMUSCULAR | Status: AC
  Filled 2016-09-21: qty 2

## 2016-09-21 MED ORDER — MORPHINE SULFATE (PF) 2 MG/ML IV SOLN
2.0000 mg | INTRAVENOUS | Status: DC | PRN
  Administered 2016-09-21: 2 mg via INTRAVENOUS
  Filled 2016-09-21: qty 1

## 2016-09-21 MED ORDER — ONDANSETRON 4 MG PO TBDP: 4.0000 mg | ORAL_TABLET | Freq: Four times a day (QID) | ORAL | Status: DC | PRN

## 2016-09-21 MED ORDER — ONDANSETRON HCL 4 MG/2ML IJ SOLN: 4.0000 mg | Freq: Once | INTRAMUSCULAR | Status: DC | PRN

## 2016-09-21 MED ORDER — DEXAMETHASONE SODIUM PHOSPHATE 10 MG/ML IJ SOLN
INTRAMUSCULAR | Status: AC
  Filled 2016-09-21: qty 1

## 2016-09-21 MED ORDER — FENTANYL CITRATE (PF) 100 MCG/2ML IJ SOLN
INTRAMUSCULAR | Status: DC | PRN
  Administered 2016-09-21 (×2): 50 ug via INTRAVENOUS

## 2016-09-21 MED ORDER — PROPOFOL 10 MG/ML IV BOLUS
INTRAVENOUS | Status: AC
  Filled 2016-09-21: qty 20

## 2016-09-21 MED ORDER — MIDAZOLAM HCL 2 MG/2ML IJ SOLN
INTRAMUSCULAR | Status: DC | PRN
  Administered 2016-09-21: 2 mg via INTRAVENOUS

## 2016-09-21 MED ORDER — KETOROLAC TROMETHAMINE 30 MG/ML IJ SOLN
INTRAMUSCULAR | Status: DC | PRN
  Administered 2016-09-21: 30 mg via INTRAVENOUS

## 2016-09-21 MED ORDER — ONDANSETRON HCL 4 MG/2ML IJ SOLN
INTRAMUSCULAR | Status: DC | PRN
  Administered 2016-09-21: 4 mg via INTRAVENOUS

## 2016-09-21 MED ORDER — ACETAMINOPHEN 10 MG/ML IV SOLN
INTRAVENOUS | Status: AC
  Filled 2016-09-21: qty 100

## 2016-09-21 MED ORDER — LIDOCAINE HCL (CARDIAC) 20 MG/ML IV SOLN
INTRAVENOUS | Status: DC | PRN
  Administered 2016-09-21: 80 mg via INTRAVENOUS

## 2016-09-21 MED ORDER — ACETAMINOPHEN 10 MG/ML IV SOLN
INTRAVENOUS | Status: DC | PRN
  Administered 2016-09-21: 1000 mg via INTRAVENOUS

## 2016-09-21 MED ORDER — FAMOTIDINE 20 MG PO TABS
ORAL_TABLET | ORAL | Status: AC
Start: 2016-09-21 — End: 2016-09-21
  Filled 2016-09-21: qty 1

## 2016-09-21 MED ORDER — FENTANYL CITRATE (PF) 100 MCG/2ML IJ SOLN
INTRAMUSCULAR | Status: AC
  Administered 2016-09-21: 25 ug via INTRAVENOUS
  Filled 2016-09-21: qty 2

## 2016-09-21 MED ORDER — DEXAMETHASONE SODIUM PHOSPHATE 10 MG/ML IJ SOLN
INTRAMUSCULAR | Status: DC | PRN
  Administered 2016-09-21: 10 mg via INTRAVENOUS

## 2016-09-21 MED ORDER — KETOROLAC TROMETHAMINE 30 MG/ML IJ SOLN
INTRAMUSCULAR | Status: AC
  Filled 2016-09-21: qty 1

## 2016-09-21 MED ORDER — SODIUM CHLORIDE 0.9 % IJ SOLN
INTRAMUSCULAR | Status: AC
  Filled 2016-09-21: qty 50

## 2016-09-21 MED ORDER — ROCURONIUM BROMIDE 50 MG/5ML IV SOSY
PREFILLED_SYRINGE | INTRAVENOUS | Status: AC
  Filled 2016-09-21: qty 5

## 2016-09-21 MED ORDER — FENTANYL CITRATE (PF) 100 MCG/2ML IJ SOLN
25.0000 ug | INTRAMUSCULAR | Status: DC | PRN
  Administered 2016-09-21 (×4): 25 ug via INTRAVENOUS

## 2016-09-21 MED ORDER — ONDANSETRON HCL 4 MG/2ML IJ SOLN
4.0000 mg | Freq: Four times a day (QID) | INTRAMUSCULAR | Status: DC | PRN
  Administered 2016-09-22: 4 mg via INTRAVENOUS

## 2016-09-21 MED ORDER — SODIUM CHLORIDE 0.9 % IV SOLN
INTRAVENOUS | Status: DC | PRN
  Administered 2016-09-21: 20 mL

## 2016-09-21 MED ORDER — LACTATED RINGERS IV SOLN
INTRAVENOUS | Status: DC
  Administered 2016-09-21 – 2016-09-22 (×4): via INTRAVENOUS

## 2016-09-21 MED ORDER — SUGAMMADEX SODIUM 500 MG/5ML IV SOLN
INTRAVENOUS | Status: AC
  Filled 2016-09-21: qty 5

## 2016-09-21 MED ORDER — ACETAMINOPHEN 650 MG RE SUPP: 650.0000 mg | Freq: Four times a day (QID) | RECTAL | Status: DC | PRN

## 2016-09-21 MED ORDER — LACTATED RINGERS IV SOLN
INTRAVENOUS | Status: DC
  Administered 2016-09-21: 07:00:00 via INTRAVENOUS

## 2016-09-21 MED ORDER — PROPOFOL 10 MG/ML IV BOLUS
INTRAVENOUS | Status: DC | PRN
  Administered 2016-09-21: 150 mg via INTRAVENOUS
  Administered 2016-09-21: 50 mg via INTRAVENOUS

## 2016-09-21 MED ORDER — SUGAMMADEX SODIUM 500 MG/5ML IV SOLN
INTRAVENOUS | Status: DC | PRN
  Administered 2016-09-21: 400 mg via INTRAVENOUS

## 2016-09-21 MED ORDER — ACETAMINOPHEN 325 MG PO TABS
650.0000 mg | ORAL_TABLET | Freq: Four times a day (QID) | ORAL | Status: DC | PRN
  Administered 2016-09-22: 650 mg via ORAL
  Filled 2016-09-21: qty 2

## 2016-09-21 MED ORDER — HYDROCODONE-ACETAMINOPHEN 5-325 MG PO TABS: 1.0000 | ORAL_TABLET | ORAL | Status: DC | PRN

## 2016-09-21 MED ORDER — ONDANSETRON HCL 4 MG/2ML IJ SOLN
INTRAMUSCULAR | Status: AC
  Filled 2016-09-21: qty 2

## 2016-09-21 MED ORDER — FENTANYL CITRATE (PF) 100 MCG/2ML IJ SOLN
INTRAMUSCULAR | Status: AC
  Filled 2016-09-21: qty 2

## 2016-09-21 MED ORDER — LIDOCAINE HCL (PF) 2 % IJ SOLN
INTRAMUSCULAR | Status: AC
  Filled 2016-09-21: qty 2

## 2016-09-21 MED ORDER — ROCURONIUM BROMIDE 100 MG/10ML IV SOLN
INTRAVENOUS | Status: DC | PRN
  Administered 2016-09-21: 10 mg via INTRAVENOUS
  Administered 2016-09-21: 50 mg via INTRAVENOUS
  Administered 2016-09-21: 5 mg via INTRAVENOUS

## 2016-09-21 SURGICAL SUPPLY — 40 items
APPLIER CLIP ROT 10 11.4 M/L (STAPLE) ×2
APR CLP MED LRG 11.4X10 (STAPLE) ×1
BLADE SURG 11 STRL SS SAFETY (MISCELLANEOUS) ×2 IMPLANT
CANISTER SUCT 1200ML W/VALVE (MISCELLANEOUS) ×2 IMPLANT
CANNULA DILATOR 10 W/SLV (CANNULA) ×2 IMPLANT
CANNULA DILATOR 5 W/SLV (CANNULA) ×4 IMPLANT
CATH CHOLANG 76X19 KUMAR (CATHETERS) ×2 IMPLANT
CHLORAPREP W/TINT 26ML (MISCELLANEOUS) ×2 IMPLANT
CLIP APPLIE ROT 10 11.4 M/L (STAPLE) ×1 IMPLANT
CONRAY 60ML FOR OR (MISCELLANEOUS) ×2 IMPLANT
DEFOGGER SCOPE WARMER CLEARIFY (MISCELLANEOUS) ×1 IMPLANT
DISSECTOR KITTNER STICK (MISCELLANEOUS) ×1 IMPLANT
DISSECTORS/KITTNER STICK (MISCELLANEOUS)
DRAPE SHEET LG 3/4 BI-LAMINATE (DRAPES) ×2 IMPLANT
DRESSING TELFA 4X3 1S ST N-ADH (GAUZE/BANDAGES/DRESSINGS) ×2 IMPLANT
DRSG TEGADERM 2-3/8X2-3/4 SM (GAUZE/BANDAGES/DRESSINGS) ×8 IMPLANT
ELECT REM PT RETURN 9FT ADLT (ELECTROSURGICAL) ×2
ELECTRODE REM PT RTRN 9FT ADLT (ELECTROSURGICAL) ×1 IMPLANT
ENDOPOUCH RETRIEVER 10 (MISCELLANEOUS) ×2 IMPLANT
GLOVE BIO SURGEON STRL SZ7.5 (GLOVE) ×5 IMPLANT
GLOVE INDICATOR 8.0 STRL GRN (GLOVE) ×4 IMPLANT
GOWN STRL REUS W/ TWL LRG LVL3 (GOWN DISPOSABLE) ×3 IMPLANT
GOWN STRL REUS W/TWL LRG LVL3 (GOWN DISPOSABLE) ×8
IV LACTATED RINGERS 1000ML (IV SOLUTION) ×2 IMPLANT
KIT RM TURNOVER STRD PROC AR (KITS) ×2 IMPLANT
LABEL OR SOLS (LABEL) ×2 IMPLANT
NDL INSUFF ACCESS 14 VERSASTEP (NEEDLE) ×2 IMPLANT
NS IRRIG 500ML POUR BTL (IV SOLUTION) ×2 IMPLANT
PACK LAP CHOLECYSTECTOMY (MISCELLANEOUS) ×2 IMPLANT
SCISSORS METZENBAUM CVD 33 (INSTRUMENTS) ×2 IMPLANT
SEAL FOR SCOPE WARMER C3101 (MISCELLANEOUS) ×1 IMPLANT
SET SUCTION IRRIG HYDROSURG (IRRIGATION / IRRIGATOR) ×1 IMPLANT
STRIP CLOSURE SKIN 1/2X4 (GAUZE/BANDAGES/DRESSINGS) ×2 IMPLANT
SUT VIC AB 0 CT2 27 (SUTURE) ×2 IMPLANT
SUT VIC AB 4-0 FS2 27 (SUTURE) ×2 IMPLANT
SWABSTK COMLB BENZOIN TINCTURE (MISCELLANEOUS) ×2 IMPLANT
TROCAR VERSASTEP PLUS 5MM (TROCAR) ×1 IMPLANT
TROCAR XCEL NON-BLD 11X100MML (ENDOMECHANICALS) ×2 IMPLANT
TUBING INSUFFLATOR HI FLOW (MISCELLANEOUS) ×2 IMPLANT
WATER STERILE IRR 1000ML POUR (IV SOLUTION) ×2 IMPLANT

## 2016-09-21 NOTE — H&P (Signed)
No change in clinical history or condition since visit in office earlier this month. Prior laparotomy for splenectomy in the past may make the procedure more difficult. Lungs: Clear. Cardio: RR. For cholecystectomy.
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
## 2016-09-21 NOTE — Anesthesia Postprocedure Evaluation (Signed)
Anesthesia Post Note  Patient: [name]  Procedure(s) Performed: Procedure(s) (LRB): LAPAROSCOPIC CHOLECYSTECTOMY WITH INTRAOPERATIVE CHOLANGIOGRAM (N/A)  Patient location during evaluation: PACU Anesthesia Type: General Level of consciousness: awake Pain management: pain level controlled Vital Signs Assessment: post-procedure vital signs reviewed and stable Respiratory status: spontaneous breathing Cardiovascular status: stable Anesthetic complications: no     Last Vitals:  Vitals:   09/21/16 1000 09/21/16 1034  BP: (!) 151/80 129/73  Pulse: 74 79  Resp: 12 14  Temp:  36.4 C    Last Pain:  Vitals:   09/21/16 1050  TempSrc:   PainSc: 7                  VAN STAVEREN,Daxson Reffett

## 2016-09-21 NOTE — Anesthesia Procedure Notes (Deleted)
Performed by: Letitia Neri

## 2016-09-21 NOTE — Anesthesia Procedure Notes (Signed)
Procedure Name: Intubation Date/Time: 09/21/2016 7:38 AM Performed by: Letitia Neri Pre-anesthesia Checklist: Patient identified, Emergency Drugs available, Suction available, Patient being monitored and Timeout performed Patient Re-evaluated:Patient Re-evaluated prior to inductionOxygen Delivery Method: Circle system utilized Preoxygenation: Pre-oxygenation with 100% oxygen Intubation Type: IV induction Ventilation: Mask ventilation without difficulty Laryngoscope Size: Mac and 3 Grade View: Grade I Tube type: Oral Tube size: 7.0 mm Number of attempts: 1 Airway Equipment and Method: Stylet Placement Confirmation: ETT inserted through vocal cords under direct vision,  positive ETCO2 and breath sounds checked- equal and bilateral Secured at: 21 cm Tube secured with: Tape Dental Injury: Teeth and Oropharynx as per pre-operative assessment

## 2016-09-21 NOTE — Transfer of Care (Signed)
Immediate Anesthesia Transfer of Care Note  Patient: Kevin Mccullough  Procedure(s) Performed: Procedure(s): LAPAROSCOPIC CHOLECYSTECTOMY WITH INTRAOPERATIVE CHOLANGIOGRAM (N/A)  Patient Location: PACU  Anesthesia Type:General  Level of Consciousness: sedated  Airway & Oxygen Therapy: Patient Spontanous Breathing  Post-op Assessment: Report given to RN and Post -op Vital signs reviewed and stable  Post vital signs: Reviewed and stable  Last Vitals:  Vitals:   09/21/16 0608  BP: 129/63  Pulse: 84  Resp: 12  Temp: 36.3 C    Last Pain:  Vitals:   09/21/16 0608  TempSrc: Tympanic         Complications: No apparent anesthesia complications

## 2016-09-21 NOTE — Anesthesia Preprocedure Evaluation (Signed)
Anesthesia Evaluation  Patient identified by MRN, date of birth, ID band Patient awake    Reviewed: Allergy & Precautions, NPO status , Patient's Chart, lab work & pertinent test results  History of Anesthesia Complications (+) AWARENESS UNDER ANESTHESIA  Airway Mallampati: II       Dental  (+) Teeth Intact   Pulmonary former smoker,    breath sounds clear to auscultation       Cardiovascular Exercise Tolerance: Good  Rhythm:Regular Rate:Normal     Neuro/Psych negative neurological ROS  negative psych ROS   GI/Hepatic negative GI ROS, (+) Hepatitis -, Unspecified  Endo/Other  negative endocrine ROS  Renal/GU negative Renal ROS     Musculoskeletal   Abdominal (+) + obese,   Peds negative pediatric ROS (+)  Hematology   Anesthesia Other Findings   Reproductive/Obstetrics                             Anesthesia Physical Anesthesia Plan  ASA: II  Anesthesia Plan: General   Post-op Pain Management:    Induction: Intravenous  Airway Management Planned: Oral ETT  Additional Equipment:   Intra-op Plan:   Post-operative Plan: Extubation in OR  Informed Consent: I have reviewed the patients History and Physical, chart, labs and discussed the procedure including the risks, benefits and alternatives for the proposed anesthesia with the patient or authorized representative who has indicated his/her understanding and acceptance.     Plan Discussed with: CRNA and Surgeon  Anesthesia Plan Comments:         Anesthesia Quick Evaluation

## 2016-09-21 NOTE — Anesthesia Post-op Follow-up Note (Cosign Needed)
Anesthesia QCDR form completed.        

## 2016-09-21 NOTE — Progress Notes (Signed)
Operative findings (choledocholithiasis) reviewed with patient and wife. Indications for ERCP reviewed. Overview of procedure provided. Patient will be seen and procedure completed tomorrow by Lucilla Lame, MD.

## 2016-09-22 ENCOUNTER — Observation Stay: Payer: Medicare HMO | Admitting: Anesthesiology

## 2016-09-22 ENCOUNTER — Encounter
Admission: RE | Disposition: A | Discharge status: Home / Self Care | Payer: Self-pay | Source: Ambulatory Visit | Attending: General Surgery

## 2016-09-22 ENCOUNTER — Ambulatory Visit: Admission: RE | Admit: 2016-09-22 | Payer: Medicare HMO | Source: Ambulatory Visit | Admitting: Gastroenterology

## 2016-09-22 ENCOUNTER — Encounter: Admission: RE | Payer: Self-pay | Source: Ambulatory Visit

## 2016-09-22 DIAGNOSIS — K8050 Calculus of bile duct without cholangitis or cholecystitis without obstruction: Secondary | ICD-10-CM | POA: Diagnosis not present

## 2016-09-22 DIAGNOSIS — K8064 Calculus of gallbladder and bile duct with chronic cholecystitis without obstruction: Secondary | ICD-10-CM | POA: Diagnosis not present

## 2016-09-22 HISTORY — PX: ENDOSCOPIC RETROGRADE CHOLANGIOPANCREATOGRAPHY (ERCP) WITH PROPOFOL: SHX5810

## 2016-09-22 LAB — COMPREHENSIVE METABOLIC PANEL
ALBUMIN: 3.8 g/dL (ref 3.5–5.0)
ALT: 33 U/L (ref 17–63)
AST: 34 U/L (ref 15–41)
Alkaline Phosphatase: 50 U/L (ref 38–126)
Anion gap: 7 (ref 5–15)
BUN: 14 mg/dL (ref 6–20)
CHLORIDE: 110 mmol/L (ref 101–111)
CO2: 24 mmol/L (ref 22–32)
Calcium: 9.1 mg/dL (ref 8.9–10.3)
Creatinine, Ser: 1.07 mg/dL (ref 0.61–1.24)
GFR calc Af Amer: >60 mL/min (ref >60)
GLUCOSE: 143 mg/dL — AB (ref 65–99)
POTASSIUM: 3.8 mmol/L (ref 3.5–5.1)
SODIUM: 141 mmol/L (ref 135–145)
Total Bilirubin: 1.2 mg/dL (ref 0.3–1.2)
Total Protein: 6.8 g/dL (ref 6.5–8.1)

## 2016-09-22 LAB — SURGICAL PATHOLOGY

## 2016-09-22 SURGERY — ESOPHAGOGASTRODUODENOSCOPY (EGD) WITH PROPOFOL
Anesthesia: General

## 2016-09-22 SURGERY — ENDOSCOPIC RETROGRADE CHOLANGIOPANCREATOGRAPHY (ERCP) WITH PROPOFOL
Anesthesia: General

## 2016-09-22 MED ORDER — DIPHENHYDRAMINE HCL 25 MG PO CAPS
50.0000 mg | ORAL_CAPSULE | Freq: Every evening | ORAL | Status: DC | PRN
  Administered 2016-09-22: 50 mg via ORAL
  Filled 2016-09-22: qty 2

## 2016-09-22 MED ORDER — FENTANYL CITRATE (PF) 100 MCG/2ML IJ SOLN
INTRAMUSCULAR | Status: AC
  Filled 2016-09-22: qty 2

## 2016-09-22 MED ORDER — SUGAMMADEX SODIUM 200 MG/2ML IV SOLN
INTRAVENOUS | Status: AC
  Filled 2016-09-22: qty 2

## 2016-09-22 MED ORDER — LIDOCAINE HCL (CARDIAC) 20 MG/ML IV SOLN
INTRAVENOUS | Status: DC | PRN
  Administered 2016-09-22: 60 mg via INTRAVENOUS

## 2016-09-22 MED ORDER — SUCCINYLCHOLINE CHLORIDE 200 MG/10ML IV SOSY
PREFILLED_SYRINGE | INTRAVENOUS | Status: AC
  Filled 2016-09-22: qty 10

## 2016-09-22 MED ORDER — ROCURONIUM BROMIDE 50 MG/5ML IV SOSY
PREFILLED_SYRINGE | INTRAVENOUS | Status: AC
Start: 2016-09-22 — End: 2016-09-22
  Filled 2016-09-22: qty 5

## 2016-09-22 MED ORDER — MIDAZOLAM HCL 2 MG/2ML IJ SOLN
INTRAMUSCULAR | Status: AC
  Filled 2016-09-22: qty 2

## 2016-09-22 MED ORDER — ONDANSETRON HCL 4 MG/2ML IJ SOLN: 4.0000 mg | Freq: Once | INTRAMUSCULAR | Status: DC | PRN

## 2016-09-22 MED ORDER — SUCCINYLCHOLINE CHLORIDE 20 MG/ML IJ SOLN
INTRAMUSCULAR | Status: DC | PRN
  Administered 2016-09-22: 100 mg via INTRAVENOUS

## 2016-09-22 MED ORDER — SUGAMMADEX SODIUM 200 MG/2ML IV SOLN
INTRAVENOUS | Status: DC | PRN
  Administered 2016-09-22: 227.8 mg via INTRAVENOUS

## 2016-09-22 MED ORDER — MIDAZOLAM HCL 2 MG/2ML IJ SOLN
INTRAMUSCULAR | Status: DC | PRN
  Administered 2016-09-22: 2 mg via INTRAVENOUS

## 2016-09-22 MED ORDER — LIDOCAINE HCL (PF) 2 % IJ SOLN
INTRAMUSCULAR | Status: AC
  Filled 2016-09-22: qty 2

## 2016-09-22 MED ORDER — PROPOFOL 10 MG/ML IV BOLUS
INTRAVENOUS | Status: AC
Start: 2016-09-22 — End: 2016-09-22
  Filled 2016-09-22: qty 20

## 2016-09-22 MED ORDER — ONDANSETRON HCL 4 MG/2ML IJ SOLN
INTRAMUSCULAR | Status: AC
  Filled 2016-09-22: qty 2

## 2016-09-22 MED ORDER — DEXAMETHASONE SODIUM PHOSPHATE 10 MG/ML IJ SOLN
INTRAMUSCULAR | Status: DC | PRN
  Administered 2016-09-22: 10 mg via INTRAVENOUS

## 2016-09-22 MED ORDER — FENTANYL CITRATE (PF) 100 MCG/2ML IJ SOLN
INTRAMUSCULAR | Status: DC | PRN
  Administered 2016-09-22 (×2): 50 ug via INTRAVENOUS

## 2016-09-22 MED ORDER — DEXAMETHASONE SODIUM PHOSPHATE 10 MG/ML IJ SOLN
INTRAMUSCULAR | Status: AC
  Filled 2016-09-22: qty 1

## 2016-09-22 MED ORDER — PHENYLEPHRINE HCL 10 MG/ML IJ SOLN
INTRAMUSCULAR | Status: DC | PRN
  Administered 2016-09-22: 100 ug via INTRAVENOUS

## 2016-09-22 MED ORDER — FENTANYL CITRATE (PF) 100 MCG/2ML IJ SOLN: 25.0000 ug | INTRAMUSCULAR | Status: DC | PRN

## 2016-09-22 MED ORDER — PROPOFOL 10 MG/ML IV BOLUS
INTRAVENOUS | Status: DC | PRN
  Administered 2016-09-22: 200 mg via INTRAVENOUS

## 2016-09-22 MED ORDER — ROCURONIUM BROMIDE 100 MG/10ML IV SOLN
INTRAVENOUS | Status: DC | PRN
  Administered 2016-09-22: 50 mg via INTRAVENOUS

## 2016-09-22 MED ORDER — INDOMETHACIN 50 MG RE SUPP
100.0000 mg | Freq: Once | RECTAL | Status: AC
  Administered 2016-09-22: 100 mg via RECTAL

## 2016-09-22 NOTE — Op Note (Signed)
[facility] Gastroenterology Patient Name: Kevin Mccullough Procedure Date: 09/22/2016 12:22 PM MRN: [id number] Account #: [id number] Date of Birth: 10-09-1950 Admit Type: Inpatient Age: 66 Room: [facility] ENDO ROOM 4 Gender: Male Note Status: Finalized Procedure:            ERCP Indications:          Bile duct stone(s) Providers:            Lucilla Lame MD, MD Referring MD:         Signe Colt (Referring MD) Medicines:            General Anesthesia Complications:        No immediate complications. Procedure:            Pre-Anesthesia Assessment:                       - Prior to the procedure, a History and Physical was                        performed, and patient medications and allergies were                        reviewed. The patient's tolerance of previous                        anesthesia was also reviewed. The risks and benefits of                        the procedure and the sedation options and risks were                        discussed with the patient. All questions were                        answered, and informed consent was obtained. Prior                        Anticoagulants: The patient has taken no previous                        anticoagulant or antiplatelet agents. ASA Grade                        Assessment: II - A patient with mild systemic disease.                        After reviewing the risks and benefits, the patient was                        deemed in satisfactory condition to undergo the                        procedure.                       After obtaining informed consent, the scope was passed                        under direct vision. Throughout the procedure, the  patient's blood pressure, pulse, and oxygen saturations                        were monitored continuously. The ERCP was introduced                        through the mouth, and used to inject contrast into and                        used to  inject contrast into the bile duct. The ERCP                        was accomplished without difficulty. The patient                        tolerated the procedure well. Findings:      A scout film of the abdomen was obtained. Surgical clips were seen in       the area of the cystic duct. The esophagus was successfully intubated       under direct vision. The scope was advanced to a normal major papilla in       the descending duodenum without detailed examination of the pharynx,       larynx and associated structures, and upper GI tract. The upper GI tract       was grossly normal. The bile duct was deeply cannulated with the       short-nosed traction sphincterotome. Contrast was injected. I personally       interpreted the bile duct images. There was brisk flow of contrast       through the ducts. Image quality was excellent. Contrast extended to the       entire biliary tree. The lower third of the main bile duct contained one       stone mm. A wire was passed into the biliary tree. Biliary       sphincterotomy was made with a traction (standard) sphincterotome using       ERBE electrocautery. There was no post-sphincterotomy bleeding. The       biliary tree was swept with a 15 mm balloon starting at the bifurcation.       One stone was removed. No stones remained. Impression:           - Choledocholithiasis was found. Complete removal was                        accomplished by biliary sphincterotomy and balloon                        extraction.                       - A biliary sphincterotomy was performed.                       - The biliary tree was swept. Recommendation:       - Clear liquid diet.                       - Watch for pancreatitis, bleeding, perforation, and  cholangitis. Procedure Code(s):    --- Professional ---                       [id number], Endoscopic retrograde cholangiopancreatography                        (ERCP); with removal of  calculi/debris from                        biliary/pancreatic duct(s)                       43262, Endoscopic retrograde cholangiopancreatography                        (ERCP); with sphincterotomy/papillotomy                       [phone], Endoscopic catheterization of the biliary ductal                        system, radiological supervision and interpretation Diagnosis Code(s):    --- Professional ---                       K80.50, Calculus of bile duct without cholangitis or                        cholecystitis without obstruction CPT copyright 2016 American Medical Association. All rights reserved. The codes documented in this report are preliminary and upon coder review may  be revised to meet current compliance requirements. Lucilla Lame MD, MD 09/22/2016 1:12:22 PM This report has been signed electronically. Number of Addenda: 0 Note Initiated On: 09/22/2016 12:22 PM      [facility]

## 2016-09-22 NOTE — Anesthesia Procedure Notes (Signed)
Procedure Name: Intubation Date/Time: 09/22/2016 12:18 PM Performed by: Allean Found Pre-anesthesia Checklist: Patient identified, Emergency Drugs available, Suction available, Patient being monitored and Timeout performed Patient Re-evaluated:Patient Re-evaluated prior to inductionOxygen Delivery Method: Circle system utilized Preoxygenation: Pre-oxygenation with 100% oxygen Intubation Type: IV induction Ventilation: Mask ventilation without difficulty Laryngoscope Size: Mac and 4 Grade View: Grade I Tube type: Oral Number of attempts: 1 Airway Equipment and Method: Stylet Placement Confirmation: ETT inserted through vocal cords under direct vision,  positive ETCO2 and breath sounds checked- equal and bilateral Secured at: 24 cm Dental Injury: Teeth and Oropharynx as per pre-operative assessment

## 2016-09-22 NOTE — Progress Notes (Signed)
ERCP results discussed w/ Dr. Allen Norris. One stone identified, otherwise clear. Tolerated procedure well. Awake, comfortable in PACU. Plan: D/C home in the AM.

## 2016-09-22 NOTE — Anesthesia Post-op Follow-up Note (Cosign Needed)
Anesthesia QCDR form completed.        

## 2016-09-22 NOTE — Transfer of Care (Signed)
Immediate Anesthesia Transfer of Care Note  Patient: Kevin Mccullough  Procedure(s) Performed: Procedure(s): ENDOSCOPIC RETROGRADE CHOLANGIOPANCREATOGRAPHY (ERCP) WITH PROPOFOL (N/A)  Patient Location: PACU  Anesthesia Type:General  Level of Consciousness: awake  Airway & Oxygen Therapy: Patient Spontanous Breathing and Patient connected to nasal cannula oxygen  Post-op Assessment: Report given to RN and Post -op Vital signs reviewed and stable  Post vital signs: Reviewed and stable  Last Vitals:  Vitals:   09/22/16 1201 09/22/16 1327  BP: 123/70 111/61  Pulse: 72 79  Resp: 16 14  Temp: 36.6 C 36.4 C    Last Pain:  Vitals:   09/22/16 1327  TempSrc:   PainSc: (P) Asleep         Complications: No apparent anesthesia complications

## 2016-09-22 NOTE — Progress Notes (Signed)
AVSS. Minimal pain. Lungs: Clear. Cardio: RR. ABD: Soft. For ERCP today.

## 2016-09-22 NOTE — Anesthesia Preprocedure Evaluation (Signed)
Anesthesia Evaluation  Patient identified by MRN, date of birth, ID band  History of Anesthesia Complications (+) AWARENESS UNDER ANESTHESIA  Airway Mallampati: II       Dental  (+) Teeth Intact   Pulmonary former smoker,    breath sounds clear to auscultation       Cardiovascular Exercise Tolerance: Good  Rhythm:Regular     Neuro/Psych    GI/Hepatic negative GI ROS, (+) Hepatitis -, Unspecified  Endo/Other  negative endocrine ROS  Renal/GU      Musculoskeletal   Abdominal (+) + obese,   Peds negative pediatric ROS (+)  Hematology negative hematology ROS (+)   Anesthesia Other Findings   Reproductive/Obstetrics                             Anesthesia Physical Anesthesia Plan  ASA: II  Anesthesia Plan: General   Post-op Pain Management:    Induction: Intravenous  Airway Management Planned: Oral ETT  Additional Equipment:   Intra-op Plan:   Post-operative Plan: Extubation in OR  Informed Consent: I have reviewed the patients History and Physical, chart, labs and discussed the procedure including the risks, benefits and alternatives for the proposed anesthesia with the patient or authorized representative who has indicated his/her understanding and acceptance.     Plan Discussed with: CRNA  Anesthesia Plan Comments:         Anesthesia Quick Evaluation

## 2016-09-22 NOTE — Care Management Obs Status (Signed)
[facility] NOTIFICATION   Patient Details  Name: Kevin Mccullough MRN: [id number] Date of Birth: Jul 26, 1951   Medicare Observation Status Notification Given:  Yes    Beverly Sessions, RN 09/22/2016, 10:01 AM

## 2016-09-23 NOTE — Progress Notes (Signed)
09/23/2016 10:29 AM  BP 124/68 (BP Location: Left Arm)   Pulse (!) 56   Temp 97.8 F (36.6 C) (Oral)   Resp 18   Ht 5\' 8"  (1.727 m)   Wt 113.9 kg (251 lb)   SpO2 96%   BMI 38.16 kg/m  Patient discharged per MD orders. Discharge instructions reviewed with patient and patient verbalized understanding. IV removed per policy. Prescriptions discussed and given to patient. Discharged via wheelchair escorted by auxilary.  Almedia Balls, RN

## 2016-09-23 NOTE — Final Progress Note (Signed)
AVSS. Tolerated liquids well. Lungs: Clear. Cardio: RR. ABD: Soft. BS+. Dressings: Dry.

## 2016-09-25 ENCOUNTER — Encounter: Payer: Self-pay | Admitting: Gastroenterology

## 2016-09-27 NOTE — Anesthesia Postprocedure Evaluation (Signed)
Anesthesia Post Note  Patient: Kevin Mccullough  Procedure(s) Performed: Procedure(s) (LRB): ENDOSCOPIC RETROGRADE CHOLANGIOPANCREATOGRAPHY (ERCP) WITH PROPOFOL (N/A)  Patient location during evaluation: PACU Anesthesia Type: General Level of consciousness: awake Pain management: pain level controlled Vital Signs Assessment: post-procedure vital signs reviewed and stable Respiratory status: spontaneous breathing Cardiovascular status: stable Anesthetic complications: no     Last Vitals:  Vitals:   09/23/16 0459 09/23/16 0754  BP: 105/69 124/68  Pulse: 61 (!) 56  Resp: 17 18  Temp: 36.6 C 36.6 C    Last Pain:  Vitals:   09/23/16 0754  TempSrc: Oral  PainSc:                  Kevin Mccullough,Kevin Mccullough

## 2016-09-28 DIAGNOSIS — E669 Obesity, unspecified: Secondary | ICD-10-CM | POA: Diagnosis not present

## 2016-09-28 DIAGNOSIS — Z7982 Long term (current) use of aspirin: Secondary | ICD-10-CM | POA: Diagnosis not present

## 2016-09-28 DIAGNOSIS — Z9049 Acquired absence of other specified parts of digestive tract: Secondary | ICD-10-CM | POA: Diagnosis not present

## 2016-09-28 DIAGNOSIS — Z0000 Encounter for general adult medical examination without abnormal findings: Secondary | ICD-10-CM | POA: Diagnosis not present

## 2016-09-28 DIAGNOSIS — Z87891 Personal history of nicotine dependence: Secondary | ICD-10-CM | POA: Diagnosis not present

## 2016-09-28 DIAGNOSIS — Z6839 Body mass index (BMI) 39.0-39.9, adult: Secondary | ICD-10-CM | POA: Diagnosis not present

## 2016-09-29 NOTE — Addendum Note (Signed)
Addendum  created 09/29/16 1249 by Iver Nestle, MD   Sign clinical note

## 2016-09-29 NOTE — Anesthesia Postprocedure Evaluation (Signed)
Anesthesia Post Note  Patient: Kevin Mccullough  Procedure(s) Performed: Procedure(s) (LRB): LAPAROSCOPIC CHOLECYSTECTOMY WITH INTRAOPERATIVE CHOLANGIOGRAM (N/A)  Patient location during evaluation: PACU Anesthesia Type: General Level of consciousness: awake Pain management: pain level controlled Vital Signs Assessment: post-procedure vital signs reviewed and stable Respiratory status: spontaneous breathing Cardiovascular status: stable Anesthetic complications: no     Last Vitals:  Vitals:   09/23/16 0459 09/23/16 0754  BP: 105/69 124/68  Pulse: 61 (!) 56  Resp: 17 18  Temp: 36.6 C 36.6 C    Last Pain:  Vitals:   09/23/16 0754  TempSrc: Oral  PainSc:                  VAN STAVEREN,Angellee Cohill

## 2016-10-05 ENCOUNTER — Ambulatory Visit (INDEPENDENT_AMBULATORY_CARE_PROVIDER_SITE_OTHER): Payer: Medicare HMO | Admitting: General Surgery

## 2016-10-05 ENCOUNTER — Encounter: Payer: Self-pay | Admitting: General Surgery

## 2016-10-05 VITALS — BP 132/80 | HR 76 | Resp 14 | Ht 68.0 in | Wt 252.0 lb

## 2016-10-05 DIAGNOSIS — K8020 Calculus of gallbladder without cholecystitis without obstruction: Secondary | ICD-10-CM

## 2016-10-05 DIAGNOSIS — K8040 Calculus of bile duct with cholecystitis, unspecified, without obstruction: Secondary | ICD-10-CM

## 2016-10-05 NOTE — Patient Instructions (Signed)
The patient is aware to call back for any questions or concerns.  
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
## 2016-10-06 DIAGNOSIS — K8040 Calculus of bile duct with cholecystitis, unspecified, without obstruction: Secondary | ICD-10-CM | POA: Insufficient documentation

## 2016-10-07 NOTE — Discharge Summary (Signed)
Physician Discharge Summary  Patient ID: Kevin Mccullough MRN: [id number] DOB/AGE: [birth year] 66 y.o.  Admit date: 09/21/2016 Discharge date: 10/07/2016  Admission Diagnoses: chronic cholecystitis, cholelithiasis with choledocholithiasis.  Discharge Diagnoses:  Active Problems:   Choledocholithiasis   Calculus of bile duct without cholecystitis and without obstruction   Discharged Condition: good  Hospital Course: the patient was admitted for elective cholecystectomy. Intraoperative cholangiogram showed evidence of choledocholithiasis without biliary obstruction. The patient subsequently underwent an ERCP with stone extraction. He was discharged home post procedure day 1 after the ERCP without complication.  Consults: GI  Significant Diagnostic Studies: endoscopy: ERCP: yes  Treatments: surgery: cholecystectomy  Discharge Exam: Blood pressure 124/68, pulse (!) 56, temperature 97.8 F (36.6 C), temperature source Oral, resp. rate 18, height 5\' 8"  (1.727 m), weight 251 lb (113.9 kg), SpO2 96 %. General appearance: alert Resp: clear to auscultation bilaterally GI: soft, non-tender; bowel sounds normal; no masses,  no organomegaly Incision/Wound: clean and dry  Disposition: 01-Home or Self Care  Discharge Instructions    Diet - low sodium heart healthy    Complete by:  As directed    Discharge instructions    Complete by:  As directed    OK to shower  You may remove outer plastic dressings tomorrow if desired.  Tylenol/ Advil/ Aleve: If needed for soreness.  Norco (hydrocodone): If needed for pain.  This medication may constipate.  Laxative of choice if needed.  Regular diet.  No driving until pain free.  No lifting over 10 pounds.  Call office on Monday to confirm follow up appoint the first week of February.   Increase activity slowly    Complete by:  As directed      Allergies as of 09/23/2016   No Known Allergies     Medication List    TAKE these  medications   aspirin EC 81 MG tablet Take 81 mg by mouth daily.   Biotin 10000 MCG Tabs Take 10,000 mcg by mouth daily.   GARCINIA CAMBOGIA-CHROMIUM PO Take 1 capsule by mouth daily with supper.   multivitamin with minerals Tabs tablet Take 1 tablet by mouth daily.   Omega-3 350 MG Cpdr Take 350 mg by mouth daily.   Vitamin D3 2000 units Tabs Take 2,000 Units by mouth daily.        Signed: Robert Bellow 10/07/2016, 8:06 AM

## 2016-10-25 DIAGNOSIS — R69 Illness, unspecified: Secondary | ICD-10-CM | POA: Diagnosis not present

## 2016-11-03 ENCOUNTER — Encounter: Payer: Self-pay | Admitting: Family Medicine

## 2016-11-03 ENCOUNTER — Ambulatory Visit (INDEPENDENT_AMBULATORY_CARE_PROVIDER_SITE_OTHER): Payer: Medicare HMO | Admitting: Family Medicine

## 2016-11-03 VITALS — BP 116/78 | HR 80 | Temp 97.5°F | Resp 16 | Wt 261.0 lb

## 2016-11-03 DIAGNOSIS — M545 Low back pain, unspecified: Secondary | ICD-10-CM

## 2016-11-03 MED ORDER — CYCLOBENZAPRINE HCL 5 MG PO TABS: 5.0000 mg | ORAL_TABLET | Freq: Three times a day (TID) | ORAL | 1 refills | Status: DC | PRN

## 2016-11-03 NOTE — Patient Instructions (Signed)
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
## 2017-01-30 ENCOUNTER — Encounter: Payer: Self-pay | Admitting: Family Medicine

## 2017-01-30 ENCOUNTER — Ambulatory Visit
Admission: RE | Admit: 2017-01-30 | Discharge: 2017-01-30 | Disposition: A | Discharge status: Home / Self Care | Payer: Medicare HMO | Source: Ambulatory Visit | Attending: Family Medicine | Admitting: Family Medicine

## 2017-01-30 ENCOUNTER — Ambulatory Visit (INDEPENDENT_AMBULATORY_CARE_PROVIDER_SITE_OTHER): Payer: Medicare HMO | Admitting: Family Medicine

## 2017-01-30 VITALS — BP 130/72 | HR 84 | Temp 98.0°F | Resp 18 | Wt 260.8 lb

## 2017-01-30 DIAGNOSIS — M25552 Pain in left hip: Secondary | ICD-10-CM | POA: Diagnosis not present

## 2017-01-30 DIAGNOSIS — M1612 Unilateral primary osteoarthritis, left hip: Secondary | ICD-10-CM | POA: Diagnosis not present

## 2017-01-30 MED ORDER — NAPROXEN 500 MG PO TBEC: 500.0000 mg | DELAYED_RELEASE_TABLET | Freq: Two times a day (BID) | ORAL | 0 refills | Status: DC
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
## 2017-02-01 ENCOUNTER — Telehealth: Payer: Self-pay

## 2017-02-01 NOTE — Telephone Encounter (Signed)
-----   Message from Margo Common, [place] sent at 02/01/2017  7:55 AM EDT ----- Left hip looks stable without abnormality of prosthetic hip joint. Some degenerative changes in the right hip (arthritis). Proceed with EC-Naprosyn BID and recheck progress in 2-3 weeks if needed.

## 2017-02-01 NOTE — Telephone Encounter (Signed)
Patient advised.

## 2017-05-21 DIAGNOSIS — R69 Illness, unspecified: Secondary | ICD-10-CM | POA: Diagnosis not present

## 2017-06-04 ENCOUNTER — Encounter: Payer: Self-pay | Admitting: Family Medicine

## 2017-06-04 ENCOUNTER — Ambulatory Visit (INDEPENDENT_AMBULATORY_CARE_PROVIDER_SITE_OTHER): Payer: Medicare HMO | Admitting: Family Medicine

## 2017-06-04 VITALS — BP 126/88 | HR 72 | Temp 97.5°F | Wt 259.0 lb

## 2017-06-04 DIAGNOSIS — R7309 Other abnormal glucose: Secondary | ICD-10-CM | POA: Diagnosis not present

## 2017-06-04 DIAGNOSIS — Z1159 Encounter for screening for other viral diseases: Secondary | ICD-10-CM | POA: Diagnosis not present

## 2017-06-04 DIAGNOSIS — K529 Noninfective gastroenteritis and colitis, unspecified: Secondary | ICD-10-CM | POA: Diagnosis not present

## 2017-06-04 MED ORDER — DICYCLOMINE HCL 10 MG PO CAPS: 10.0000 mg | ORAL_CAPSULE | Freq: Three times a day (TID) | ORAL | 0 refills | Status: DC

## 2017-06-04 MED ORDER — RANITIDINE HCL 150 MG PO TABS: 150.0000 mg | ORAL_TABLET | Freq: Two times a day (BID) | ORAL | 3 refills | Status: DC
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
## 2017-06-06 LAB — CBC WITH DIFFERENTIAL/PLATELET
BASOS PCT: 0.7 %
Basophils Absolute: 60 cells/uL (ref 0–200)
EOS PCT: 1.7 %
Eosinophils Absolute: 146 cells/uL (ref 15–500)
HCT: 41.2 % (ref 38.5–50.0)
HEMOGLOBIN: 14.7 g/dL (ref 13.2–17.1)
Lymphs Abs: 2442 cells/uL (ref 850–3900)
MCH: 33 pg (ref 27.0–33.0)
MCHC: 35.7 g/dL (ref 32.0–36.0)
MCV: 92.4 fL (ref 80.0–100.0)
MONOS PCT: 6.6 %
MPV: 10.8 fL (ref 7.5–12.5)
NEUTROS ABS: 5384 {cells}/uL (ref 1500–7800)
Neutrophils Relative %: 62.6 %
PLATELETS: 320 10*3/uL (ref 140–400)
RBC: 4.46 10*6/uL (ref 4.20–5.80)
RDW: 13 % (ref 11.0–15.0)
TOTAL LYMPHOCYTE: 28.4 %
WBC mixed population: 568 cells/uL (ref 200–950)
WBC: 8.6 10*3/uL (ref 3.8–10.8)

## 2017-06-06 LAB — COMPREHENSIVE METABOLIC PANEL
AG RATIO: 1.6 (calc) (ref 1.0–2.5)
ALT: 22 U/L (ref 9–46)
AST: 20 U/L (ref 10–35)
Albumin: 4.2 g/dL (ref 3.6–5.1)
Alkaline phosphatase (APISO): 59 U/L (ref 40–115)
BUN: 9 mg/dL (ref 7–25)
CHLORIDE: 107 mmol/L (ref 98–110)
CO2: 26 mmol/L (ref 20–32)
CREATININE: 0.99 mg/dL (ref 0.70–1.25)
Calcium: 9.5 mg/dL (ref 8.6–10.3)
GLOBULIN: 2.7 g/dL (ref 1.9–3.7)
GLUCOSE: 136 mg/dL — AB (ref 65–99)
Potassium: 4.4 mmol/L (ref 3.5–5.3)
SODIUM: 141 mmol/L (ref 135–146)
TOTAL PROTEIN: 6.9 g/dL (ref 6.1–8.1)
Total Bilirubin: 0.8 mg/dL (ref 0.2–1.2)

## 2017-06-06 LAB — HEPATITIS C ANTIBODY
Hepatitis C Ab: NONREACTIVE
SIGNAL TO CUT-OFF: 0.03 (ref <1.00)

## 2017-06-06 LAB — TEST AUTHORIZATION

## 2017-06-06 LAB — HEMOGLOBIN A1C W/OUT EAG: Hgb A1c MFr Bld: 6.1 % of total Hgb — ABNORMAL HIGH (ref <5.7)

## 2017-06-08 ENCOUNTER — Encounter: Payer: Self-pay | Admitting: Family Medicine

## 2017-07-17 ENCOUNTER — Ambulatory Visit (INDEPENDENT_AMBULATORY_CARE_PROVIDER_SITE_OTHER): Payer: Medicare HMO | Admitting: Family Medicine

## 2017-07-17 DIAGNOSIS — Z23 Encounter for immunization: Secondary | ICD-10-CM | POA: Diagnosis not present

## 2017-09-07 ENCOUNTER — Ambulatory Visit: Payer: Medicare HMO | Admitting: Family Medicine

## 2017-12-03 ENCOUNTER — Ambulatory Visit
Admission: RE | Admit: 2017-12-03 | Discharge: 2017-12-03 | Disposition: A | Discharge status: Home / Self Care | Payer: Medicare HMO | Source: Ambulatory Visit | Attending: Family Medicine | Admitting: Family Medicine

## 2017-12-03 ENCOUNTER — Encounter: Payer: Self-pay | Admitting: Family Medicine

## 2017-12-03 ENCOUNTER — Ambulatory Visit (INDEPENDENT_AMBULATORY_CARE_PROVIDER_SITE_OTHER): Payer: Medicare HMO | Admitting: Family Medicine

## 2017-12-03 VITALS — BP 118/78 | HR 70 | Temp 98.0°F | Resp 16 | Wt 237.0 lb

## 2017-12-03 DIAGNOSIS — M79671 Pain in right foot: Secondary | ICD-10-CM | POA: Diagnosis not present

## 2017-12-03 DIAGNOSIS — M19071 Primary osteoarthritis, right ankle and foot: Secondary | ICD-10-CM | POA: Insufficient documentation

## 2017-12-03 MED ORDER — HYDROCODONE-ACETAMINOPHEN 5-300 MG PO TABS: 1.0000 | ORAL_TABLET | Freq: Four times a day (QID) | ORAL | 0 refills | Status: DC | PRN

## 2017-12-03 NOTE — Progress Notes (Signed)
Patient: Kevin Mccullough Male    DOB: 05/03/1951   67 y.o.   MRN: [id number] Visit Date: 12/03/2017  Today's Provider: Vernie Murders, PA   Chief Complaint  Patient presents with  . Foot Pain   Subjective:    Foot Pain  This is a recurrent problem. The current episode started 1 to 4 weeks ago. The problem occurs intermittently. Associated symptoms include arthralgias and myalgias. The symptoms are aggravated by walking. He has tried rest for the symptoms. The treatment provided mild relief.   Patient reports that his foot pain only happens twice a year. He describes it as a shooting, sharp pain that is located in the arch of his right foot.     No Known Allergies   Current Outpatient Medications:  .  aspirin EC 81 MG tablet, Take 81 mg by mouth daily., Disp: , Rfl:  .  Biotin 10000 MCG TABS, Take 10,000 mcg by mouth daily., Disp: , Rfl:  .  Cholecalciferol (VITAMIN D3) 2000 UNITS TABS, Take 2,000 Units by mouth daily. , Disp: , Rfl:  .  dicyclomine (BENTYL) 10 MG capsule, Take 1 capsule (10 mg total) by mouth 4 (four) times daily -  before meals and at bedtime., Disp: 30 capsule, Rfl: 0 .  Multiple Vitamin (MULTIVITAMIN WITH MINERALS) TABS tablet, Take 1 tablet by mouth daily., Disp: , Rfl:  .  naproxen (EC NAPROSYN) 500 MG EC tablet, Take 1 tablet (500 mg total) by mouth 2 (two) times daily with a meal., Disp: 60 tablet, Rfl: 0 .  Omega-3 350 MG CPDR, Take 350 mg by mouth daily., Disp: , Rfl:  .  ranitidine (ZANTAC) 150 MG tablet, Take 1 tablet (150 mg total) by mouth 2 (two) times daily., Disp: 60 tablet, Rfl: 3  Review of Systems  Constitutional: Negative.   Cardiovascular: Negative for leg swelling.  Musculoskeletal: Positive for arthralgias and myalgias.    Social History   Tobacco Use  . Smoking status: Former Smoker    Packs/day: 1.00    Years: 17.00    Pack years: 17.00    Types: Cigarettes    Last attempt to quit: 11/27/1987    Years since quitting: 30.0    . Smokeless tobacco: Never Used  . Tobacco comment: QUIT IN 1989  Substance Use Topics  . Alcohol use: No    Alcohol/week: 0.0 oz   Objective:   BP 118/78 (BP Location: Left Arm, Patient Position: Sitting, Cuff Size: Large)   Pulse 70   Temp 98 F (36.7 C)   Resp 16   Wt 237 lb (107.5 kg)   SpO2 98%   BMI 36.04 kg/m  Vitals:   12/03/17 1130  BP: 118/78  Pulse: 70  Resp: 16  Temp: 98 F (36.7 C)  SpO2: 98%  Weight: 237 lb (107.5 kg)   Physical Exam  Constitutional: He is oriented to person, place, and time. He appears well-developed and well-nourished. No distress.  HENT:  Head: Normocephalic and atraumatic.  Right Ear: Hearing normal.  Left Ear: Hearing normal.  Nose: Nose normal.  Eyes: Conjunctivae and lids are normal. Right eye exhibits no discharge. Left eye exhibits no discharge. No scleral icterus.  Pulmonary/Chest: Effort normal. No respiratory distress.  Musculoskeletal: Normal range of motion. He exhibits no tenderness.  Neurological: He is alert and oriented to person, place, and time.  Skin: Skin is intact. No lesion and no rash noted.  Psychiatric: He has a normal mood and  affect. His speech is normal and behavior is normal. Thought content normal.      Assessment & Plan:     1. Right foot pain Had sharp stabbing pains in the right foot (laterally) that lasted 24 (yesterday). Has had similar episodes once or twice a year. Tried Lyrica 50 mg TID and Naproxen BID without much immediate relief. No pain today. Requests Norco for more immediate relief when episodes occur. Will check x-ray of foot and follow up prn. May need referral to podiatrist. - DG Foot Complete Right       Vernie Murders, [facility]

## 2017-12-05 DIAGNOSIS — R69 Illness, unspecified: Secondary | ICD-10-CM | POA: Diagnosis not present

## 2017-12-25 DIAGNOSIS — Z833 Family history of diabetes mellitus: Secondary | ICD-10-CM | POA: Diagnosis not present

## 2017-12-25 DIAGNOSIS — Z6836 Body mass index (BMI) 36.0-36.9, adult: Secondary | ICD-10-CM | POA: Diagnosis not present

## 2017-12-25 DIAGNOSIS — Z809 Family history of malignant neoplasm, unspecified: Secondary | ICD-10-CM | POA: Diagnosis not present

## 2017-12-25 DIAGNOSIS — Z7982 Long term (current) use of aspirin: Secondary | ICD-10-CM | POA: Diagnosis not present

## 2017-12-25 DIAGNOSIS — E669 Obesity, unspecified: Secondary | ICD-10-CM | POA: Diagnosis not present

## 2017-12-25 DIAGNOSIS — Z96649 Presence of unspecified artificial hip joint: Secondary | ICD-10-CM | POA: Diagnosis not present

## 2017-12-25 DIAGNOSIS — Z825 Family history of asthma and other chronic lower respiratory diseases: Secondary | ICD-10-CM | POA: Diagnosis not present

## 2017-12-25 DIAGNOSIS — Z87891 Personal history of nicotine dependence: Secondary | ICD-10-CM | POA: Diagnosis not present

## 2018-01-08 IMAGING — CT CT RENAL STONE PROTOCOL
1 of 2 series · 15 of 32 positions shown, 19 images · non-contrast
Comparison: 06/05/2009

CLINICAL DATA: Left-sided back pain 7 p.m. tonight, Bleduar Praunseis 5
days ago but getting worse, history of spinal fusion 5855

EXAM:
CT ABDOMEN AND PELVIS WITHOUT CONTRAST
TECHNIQUE: Multidetector CT imaging of the abdomen and pelvis was performed
following the standard protocol without IV contrast.

[Series 2: stone standard full · axial · 0.85mm/px · z∈[-535,-80]mm · 15 of 101 slices shown, 19 images]
[im 5/101  soft-tissue]
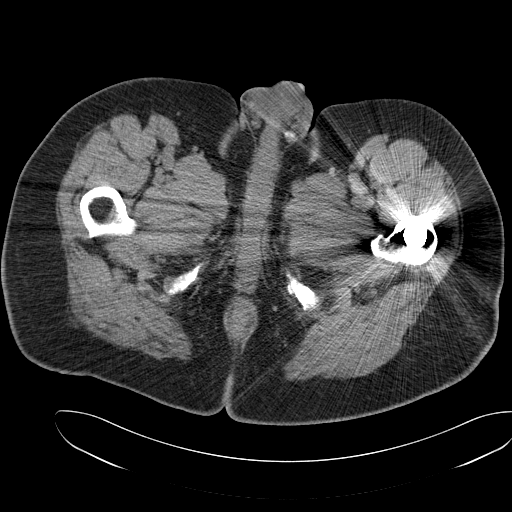
[im 5/101  bone]
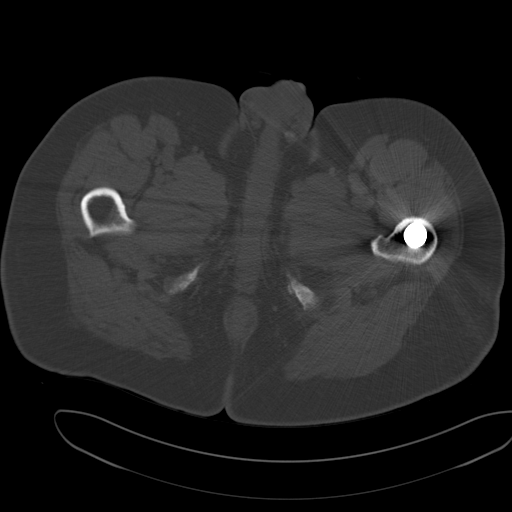
[im 13/101  soft-tissue]
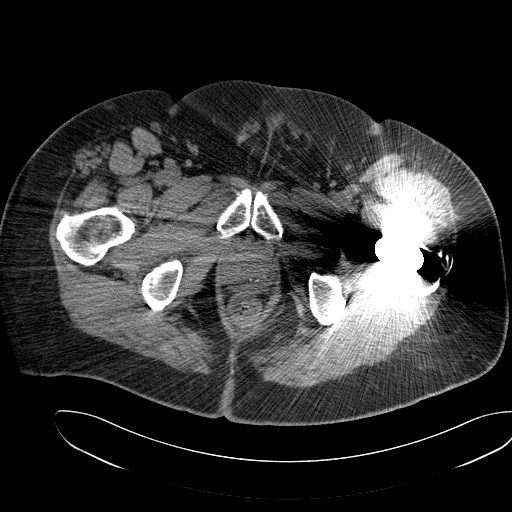
[im 21/101  soft-tissue]
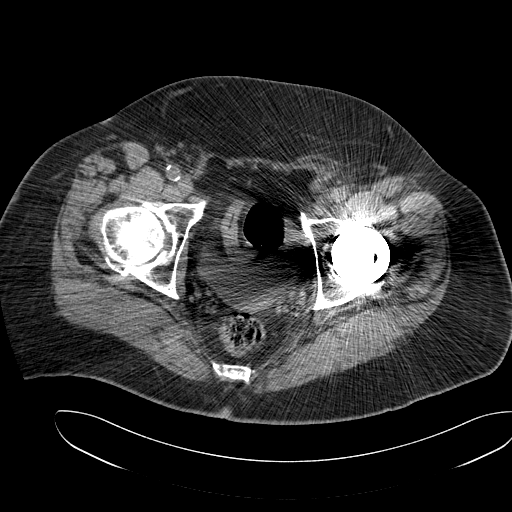
[im 30/101  soft-tissue]
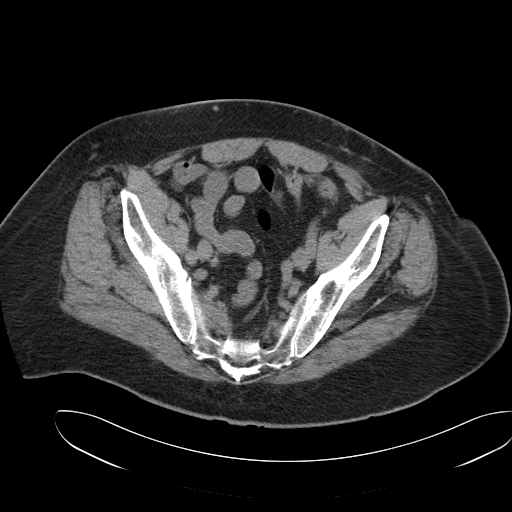
[im 34/101  soft-tissue]
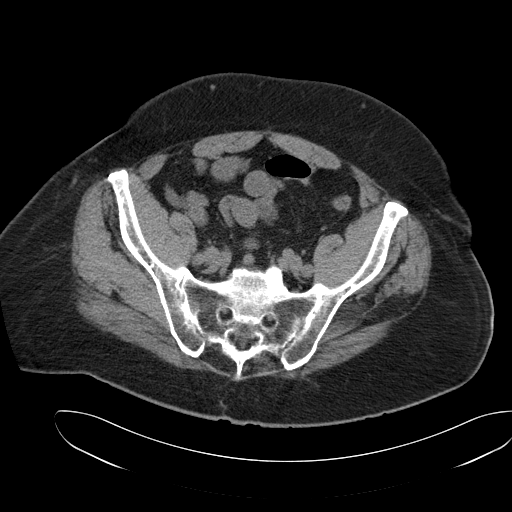
[im 42/101  soft-tissue]
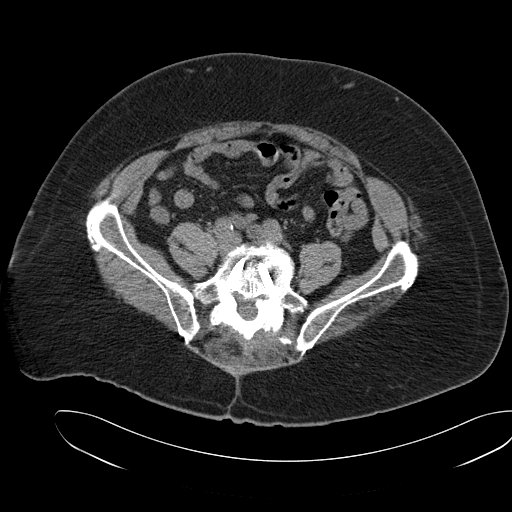
[im 51/101  soft-tissue]
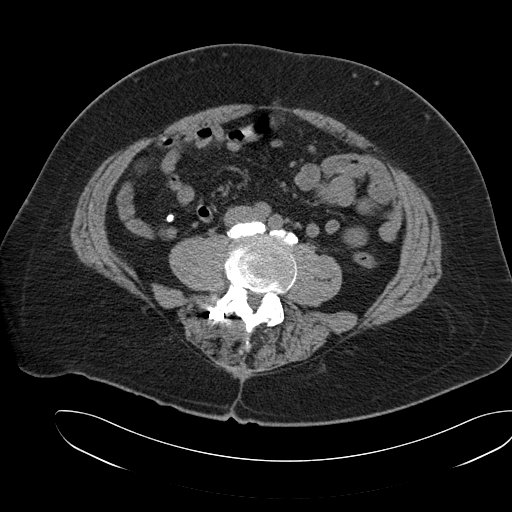
[im 59/101  soft-tissue]
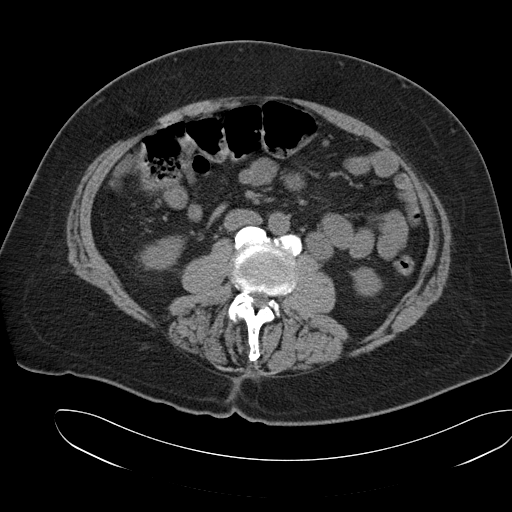
[im 67/101  soft-tissue]
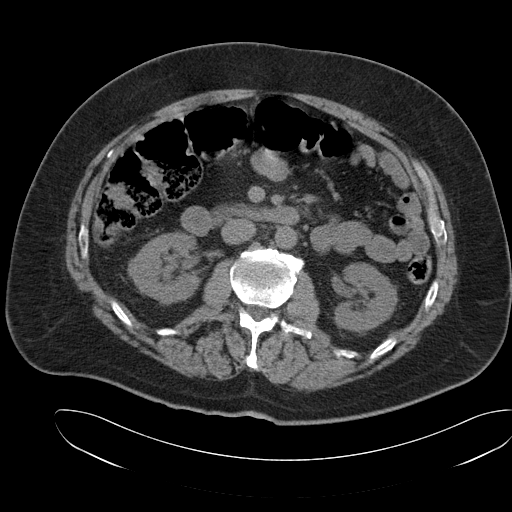
[im 67/101  bone]
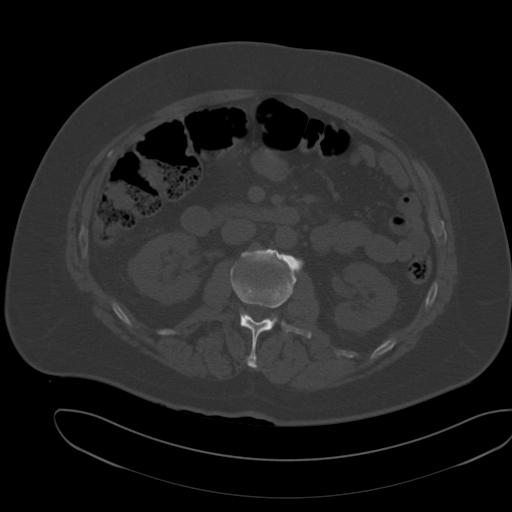
[im 71/101  soft-tissue]
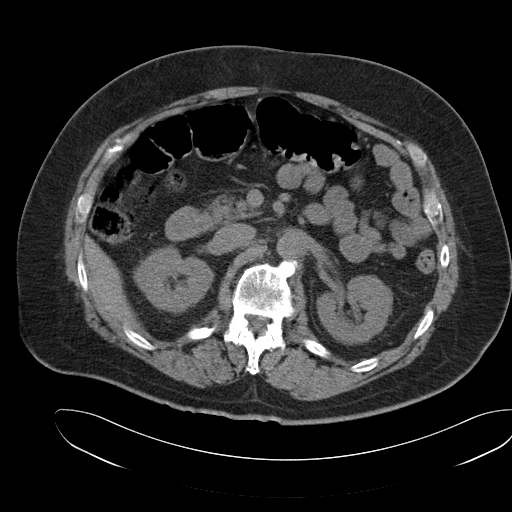
[im 80/101  soft-tissue]
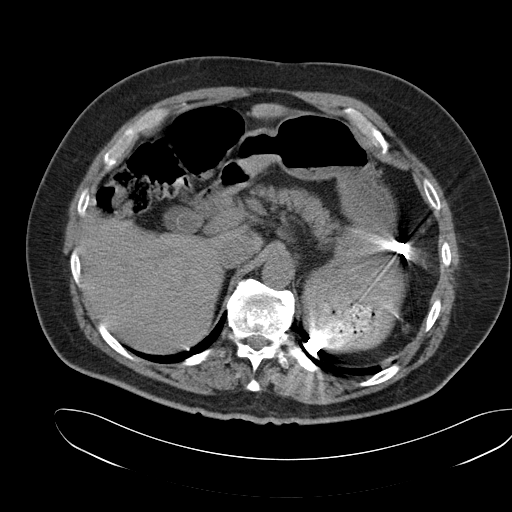
[im 84/101  lung]
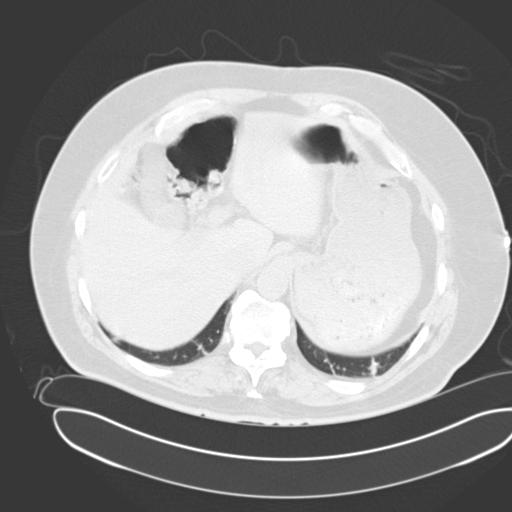
[im 88/101  soft-tissue]
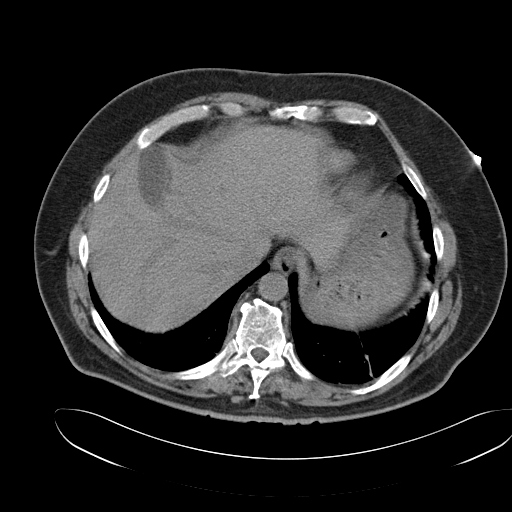
[im 88/101  lung]
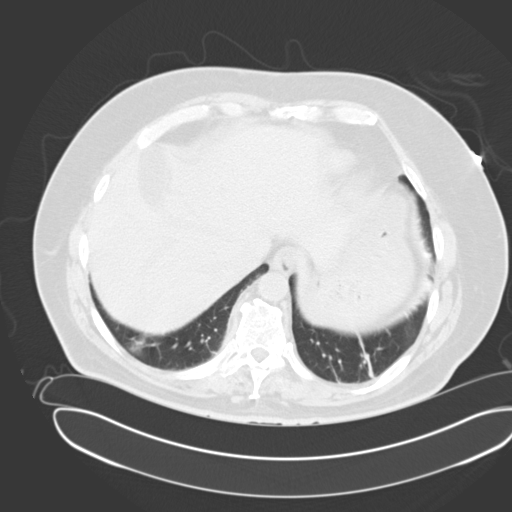
[im 92/101  lung]
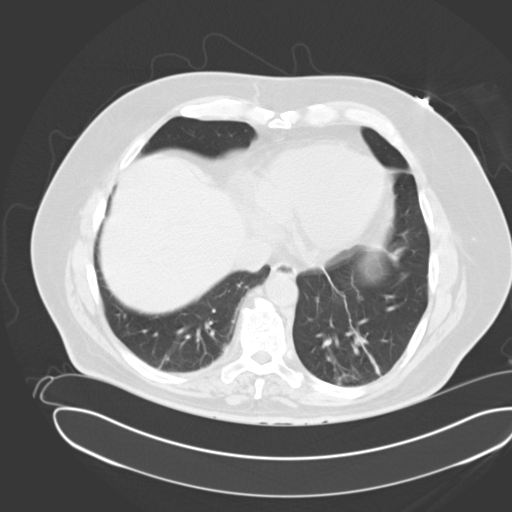
[im 96/101  soft-tissue]
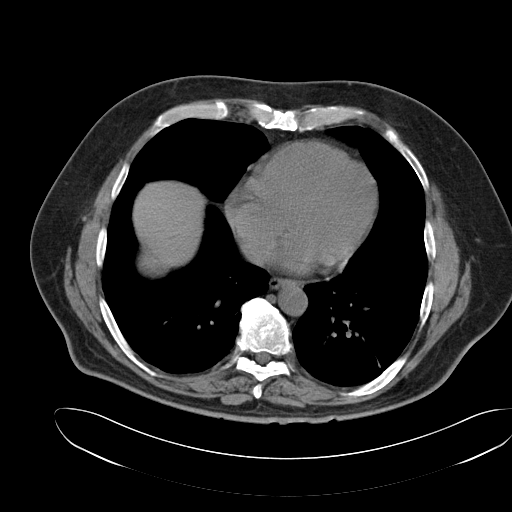
[im 96/101  lung]
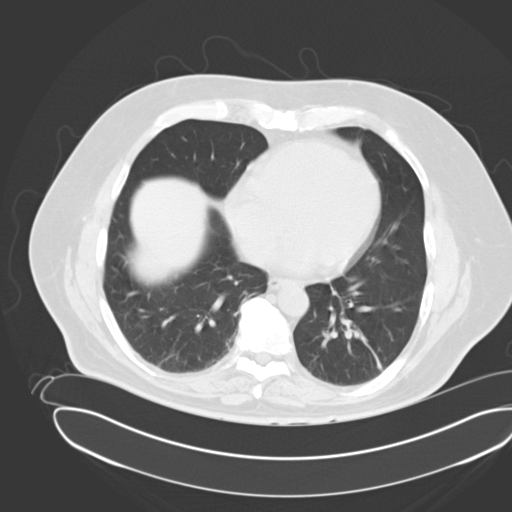

[15 of 32 positions shown; findings below may reference images not displayed]

FINDINGS: Lower chest: Multifocal mild subsegmental atelectasis at both lung
bases.

Hepatobiliary: 2 cm noncalcified gallstone noted. No pericholecystic
inflammation. Liver appears normal in its non contrasted appearance.

Pancreas: Negative

Spleen: Spleen not identified. 1 cm splenule noted. Multiple for
oval clip along the left upper quadrant suggesting prior
splenectomy.

Adrenals/Urinary Tract: Adrenal glands are negative. Kidneys appear
negative without contrast. Bladder grossly negative but largely
obscured by beam attenuation artifact.

Stomach/Bowel: Nonobstructive bowel gas pattern. Dense material
within the appendix. The appendix is not dilated and there is no
inflammatory change around the appendix.

Vascular/Lymphatic: No significant adenopathy in the abdomen or
pelvis. No evidence of aortic dilatation.

Reproductive: Negative

Other: No ascites

Musculoskeletal: Beam attenuation artifact from left hip
replacement. Also L4-5 spinal fusion noted. No acute musculoskeletal
findings. Multilevel degenerative disc disease throughout the
visualized thoracic and lumbar spine.
IMPRESSION: Cholelithiasis.  Evidence of prior splenectomy.  No acute findings.

## 2018-04-24 DIAGNOSIS — H2513 Age-related nuclear cataract, bilateral: Secondary | ICD-10-CM | POA: Diagnosis not present

## 2018-04-24 DIAGNOSIS — H524 Presbyopia: Secondary | ICD-10-CM | POA: Diagnosis not present

## 2018-05-17 ENCOUNTER — Telehealth: Payer: Self-pay | Admitting: Family Medicine

## 2018-05-17 NOTE — Telephone Encounter (Signed)
I called to schedule the pt's AWV-S w/ [facility].  He said that they came to his house 2-3 months ago, so he doesn't feel like he needs it. VDM (DD)

## 2018-06-04 DIAGNOSIS — R69 Illness, unspecified: Secondary | ICD-10-CM | POA: Diagnosis not present

## 2018-06-10 ENCOUNTER — Ambulatory Visit (INDEPENDENT_AMBULATORY_CARE_PROVIDER_SITE_OTHER): Payer: Medicare HMO | Admitting: Family Medicine

## 2018-06-10 ENCOUNTER — Encounter: Payer: Self-pay | Admitting: Family Medicine

## 2018-06-10 ENCOUNTER — Other Ambulatory Visit: Payer: Self-pay

## 2018-06-10 VITALS — BP 118/80 | HR 75 | Temp 97.8°F | Ht 70.0 in | Wt 246.2 lb

## 2018-06-10 DIAGNOSIS — H9312 Tinnitus, left ear: Secondary | ICD-10-CM | POA: Diagnosis not present
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
## 2018-06-12 DIAGNOSIS — R69 Illness, unspecified: Secondary | ICD-10-CM | POA: Diagnosis not present

## 2018-06-17 ENCOUNTER — Ambulatory Visit
Admission: RE | Admit: 2018-06-17 | Discharge: 2018-06-17 | Disposition: A | Discharge status: Home / Self Care | Payer: Medicare HMO | Source: Ambulatory Visit | Attending: Family Medicine | Admitting: Family Medicine

## 2018-06-17 ENCOUNTER — Ambulatory Visit (INDEPENDENT_AMBULATORY_CARE_PROVIDER_SITE_OTHER): Payer: Medicare HMO | Admitting: Family Medicine

## 2018-06-17 ENCOUNTER — Encounter: Payer: Self-pay | Admitting: Family Medicine

## 2018-06-17 ENCOUNTER — Other Ambulatory Visit: Payer: Self-pay

## 2018-06-17 VITALS — BP 118/80 | HR 69 | Temp 97.7°F | Ht 70.0 in | Wt 245.8 lb

## 2018-06-17 DIAGNOSIS — M25561 Pain in right knee: Secondary | ICD-10-CM

## 2018-06-17 DIAGNOSIS — M1711 Unilateral primary osteoarthritis, right knee: Secondary | ICD-10-CM | POA: Insufficient documentation

## 2018-06-17 MED ORDER — NAPROXEN 500 MG PO TBEC: 500.0000 mg | DELAYED_RELEASE_TABLET | Freq: Two times a day (BID) | ORAL | 0 refills | Status: DC
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
## 2018-06-18 ENCOUNTER — Telehealth: Payer: Self-pay

## 2018-06-18 NOTE — Telephone Encounter (Signed)
Patient has been advised. KW 

## 2018-06-18 NOTE — Telephone Encounter (Signed)
-----   Message from Margo Common, [place] sent at 06/18/2018  8:04 AM EDT ----- X-ray confirms arthritis with medial joint space narrowing. Recheck progress with use of EC-Naproxen and Pepcid in 2 weeks. Due for fasting labs at that time to recheck elevated sugar from last year.

## 2018-06-27 ENCOUNTER — Other Ambulatory Visit: Payer: Self-pay | Admitting: Family Medicine

## 2018-06-27 ENCOUNTER — Telehealth: Payer: Self-pay | Admitting: Family Medicine

## 2018-06-27 DIAGNOSIS — Z87898 Personal history of other specified conditions: Secondary | ICD-10-CM

## 2018-06-27 DIAGNOSIS — M25561 Pain in right knee: Secondary | ICD-10-CM

## 2018-06-27 DIAGNOSIS — E782 Mixed hyperlipidemia: Secondary | ICD-10-CM

## 2018-06-27 NOTE — Telephone Encounter (Signed)
Orders in chart (labs are future orders to be released and printed out when he comes by to get blood drawn). Orthopedic referral in chart (ask Parke Poisson to see if the referral can be set up with Dr. Marry Guan - at patient's request).

## 2018-06-27 NOTE — Telephone Encounter (Signed)
Please advise 

## 2018-06-27 NOTE — Telephone Encounter (Signed)
Kevin Mccullough put a referral in pt's chart for ortho, can you check if it can be set up with Dr Marry Guan per pt's request?  Thanks.  dbs

## 2018-06-27 NOTE — Telephone Encounter (Signed)
Pt needing order for diabetic labs - per Chrismon.  Pt wants to come in on Tues Nov. 5th to have this done.  Pt wanting a orthopedic referral   Dr. Skip Estimable w/ [facility].  Pt's knee is not getting better and getting worse.  Please advise.  Thanks, [employer]

## 2018-07-02 ENCOUNTER — Other Ambulatory Visit: Payer: Self-pay | Admitting: Family Medicine

## 2018-07-02 ENCOUNTER — Other Ambulatory Visit: Payer: Self-pay

## 2018-07-02 DIAGNOSIS — Z87898 Personal history of other specified conditions: Secondary | ICD-10-CM | POA: Diagnosis not present

## 2018-07-02 DIAGNOSIS — Z8639 Personal history of other endocrine, nutritional and metabolic disease: Secondary | ICD-10-CM | POA: Diagnosis not present

## 2018-07-02 DIAGNOSIS — I10 Essential (primary) hypertension: Secondary | ICD-10-CM | POA: Diagnosis not present

## 2018-07-02 DIAGNOSIS — E669 Obesity, unspecified: Secondary | ICD-10-CM | POA: Diagnosis not present

## 2018-07-02 DIAGNOSIS — E782 Mixed hyperlipidemia: Secondary | ICD-10-CM

## 2018-07-03 LAB — LIPID PANEL
CHOL/HDL RATIO: 4.6 ratio (ref 0.0–5.0)
CHOLESTEROL TOTAL: 231 mg/dL — AB (ref 100–199)
HDL: 50 mg/dL (ref >39)
LDL CALC: 169 mg/dL — AB (ref 0–99)
Triglycerides: 62 mg/dL (ref 0–149)
VLDL CHOLESTEROL CAL: 12 mg/dL (ref 5–40)

## 2018-07-03 LAB — CBC WITH DIFFERENTIAL/PLATELET
Basophils Absolute: 0.1 10*3/uL (ref 0.0–0.2)
Basos: 1 %
EOS (ABSOLUTE): 0.6 10*3/uL — AB (ref 0.0–0.4)
Eos: 8 %
Hematocrit: 38.8 % (ref 37.5–51.0)
Hemoglobin: 14.2 g/dL (ref 13.0–17.7)
IMMATURE GRANULOCYTES: 0 %
Immature Grans (Abs): 0 10*3/uL (ref 0.0–0.1)
LYMPHS ABS: 2.7 10*3/uL (ref 0.7–3.1)
Lymphs: 38 %
MCH: 33.3 pg — AB (ref 26.6–33.0)
MCHC: 36.6 g/dL — AB (ref 31.5–35.7)
MCV: 91 fL (ref 79–97)
MONOS ABS: 0.6 10*3/uL (ref 0.1–0.9)
Monocytes: 9 %
NEUTROS PCT: 44 %
Neutrophils Absolute: 3.3 10*3/uL (ref 1.4–7.0)
Platelets: 294 10*3/uL (ref 150–450)
RBC: 4.27 x10E6/uL (ref 4.14–5.80)
RDW: 13.1 % (ref 12.3–15.4)
WBC: 7.3 10*3/uL (ref 3.4–10.8)

## 2018-07-03 LAB — COMPREHENSIVE METABOLIC PANEL
A/G RATIO: 2.1 (ref 1.2–2.2)
ALT: 19 IU/L (ref 0–44)
AST: 30 IU/L (ref 0–40)
Albumin: 4.4 g/dL (ref 3.6–4.8)
Alkaline Phosphatase: 50 IU/L (ref 39–117)
BUN / CREAT RATIO: 8 — AB (ref 10–24)
BUN: 10 mg/dL (ref 8–27)
Bilirubin Total: 1.4 mg/dL — ABNORMAL HIGH (ref 0.0–1.2)
CALCIUM: 9.8 mg/dL (ref 8.6–10.2)
CHLORIDE: 97 mmol/L (ref 96–106)
CO2: 21 mmol/L (ref 20–29)
Creatinine, Ser: 1.21 mg/dL (ref 0.76–1.27)
GFR calc Af Amer: 71 mL/min/{1.73_m2} (ref >59)
GFR calc non Af Amer: 61 mL/min/{1.73_m2} (ref >59)
GLOBULIN, TOTAL: 2.1 g/dL (ref 1.5–4.5)
Glucose: 86 mg/dL (ref 65–99)
Potassium: 4.4 mmol/L (ref 3.5–5.2)
Sodium: 134 mmol/L (ref 134–144)
TOTAL PROTEIN: 6.5 g/dL (ref 6.0–8.5)

## 2018-07-03 LAB — HEMOGLOBIN A1C
ESTIMATED AVERAGE GLUCOSE: 131 mg/dL
HEMOGLOBIN A1C: 6.2 % — AB (ref 4.8–5.6)

## 2018-07-03 LAB — TSH: TSH: 2.46 u[IU]/mL (ref 0.450–4.500)

## 2018-07-04 ENCOUNTER — Telehealth: Payer: Self-pay

## 2018-07-04 NOTE — Telephone Encounter (Signed)
Patient advised as below. Patient verbalizes understanding and is in agreement with treatment plan.  

## 2018-07-23 DIAGNOSIS — M25561 Pain in right knee: Secondary | ICD-10-CM | POA: Diagnosis not present

## 2018-07-23 DIAGNOSIS — M1711 Unilateral primary osteoarthritis, right knee: Secondary | ICD-10-CM | POA: Diagnosis not present

## 2018-09-10 ENCOUNTER — Telehealth: Payer: Self-pay | Admitting: Family Medicine

## 2018-09-10 NOTE — Telephone Encounter (Signed)
Pt was told in a letter to let Simona Huh know Dr. Manya Silvas - [facility] will be doing a EGD on pt.  Thanks, [employer]

## 2018-09-10 NOTE — Telephone Encounter (Signed)
Would like to know "why". Appreciate the notification.

## 2018-09-11 NOTE — Telephone Encounter (Signed)
Per patient he had this done about three (3) years ago and is a recheck.

## 2018-10-04 ENCOUNTER — Ambulatory Visit: Payer: Self-pay | Admitting: Family Medicine
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
## 2018-10-07 ENCOUNTER — Ambulatory Visit (INDEPENDENT_AMBULATORY_CARE_PROVIDER_SITE_OTHER): Payer: Medicare HMO | Admitting: Family Medicine

## 2018-10-07 ENCOUNTER — Encounter: Payer: Self-pay | Admitting: Family Medicine

## 2018-10-07 VITALS — BP 110/70 | HR 90 | Temp 98.1°F | Resp 16 | Wt 250.0 lb

## 2018-10-07 DIAGNOSIS — J069 Acute upper respiratory infection, unspecified: Secondary | ICD-10-CM | POA: Diagnosis not present

## 2018-10-07 DIAGNOSIS — E7800 Pure hypercholesterolemia, unspecified: Secondary | ICD-10-CM | POA: Diagnosis not present

## 2018-10-08 ENCOUNTER — Other Ambulatory Visit: Payer: Self-pay | Admitting: *Deleted

## 2018-10-08 DIAGNOSIS — Z87898 Personal history of other specified conditions: Secondary | ICD-10-CM

## 2018-10-08 DIAGNOSIS — E782 Mixed hyperlipidemia: Secondary | ICD-10-CM

## 2018-10-08 LAB — COMPREHENSIVE METABOLIC PANEL
ALK PHOS: 64 IU/L (ref 39–117)
ALT: 28 IU/L (ref 0–44)
AST: 24 IU/L (ref 0–40)
Albumin/Globulin Ratio: 1.8 (ref 1.2–2.2)
Albumin: 4.7 g/dL (ref 3.8–4.8)
BILIRUBIN TOTAL: 1 mg/dL (ref 0.0–1.2)
BUN/Creatinine Ratio: 11 (ref 10–24)
BUN: 15 mg/dL (ref 8–27)
CHLORIDE: 100 mmol/L (ref 96–106)
CO2: 22 mmol/L (ref 20–29)
Calcium: 9.7 mg/dL (ref 8.6–10.2)
Creatinine, Ser: 1.32 mg/dL — ABNORMAL HIGH (ref 0.76–1.27)
GFR calc Af Amer: 64 mL/min/{1.73_m2} (ref >59)
GFR calc non Af Amer: 55 mL/min/{1.73_m2} — ABNORMAL LOW (ref >59)
GLOBULIN, TOTAL: 2.6 g/dL (ref 1.5–4.5)
Glucose: 148 mg/dL — ABNORMAL HIGH (ref 65–99)
Potassium: 4.6 mmol/L (ref 3.5–5.2)
SODIUM: 139 mmol/L (ref 134–144)
Total Protein: 7.3 g/dL (ref 6.0–8.5)

## 2018-10-08 LAB — CBC WITH DIFFERENTIAL/PLATELET
Basophils Absolute: 0.1 10*3/uL (ref 0.0–0.2)
Basos: 1 %
EOS (ABSOLUTE): 0.2 10*3/uL (ref 0.0–0.4)
EOS: 2 %
HEMATOCRIT: 44.2 % (ref 37.5–51.0)
Hemoglobin: 14.9 g/dL (ref 13.0–17.7)
Immature Grans (Abs): 0 10*3/uL (ref 0.0–0.1)
Immature Granulocytes: 0 %
LYMPHS ABS: 1.3 10*3/uL (ref 0.7–3.1)
Lymphs: 11 %
MCH: 31.8 pg (ref 26.6–33.0)
MCHC: 33.7 g/dL (ref 31.5–35.7)
MCV: 94 fL (ref 79–97)
MONOS ABS: 0.9 10*3/uL (ref 0.1–0.9)
Monocytes: 8 %
Neutrophils Absolute: 9.8 10*3/uL — ABNORMAL HIGH (ref 1.4–7.0)
Neutrophils: 78 %
Platelets: 352 10*3/uL (ref 150–450)
RBC: 4.69 x10E6/uL (ref 4.14–5.80)
RDW: 13.1 % (ref 11.6–15.4)
WBC: 12.4 10*3/uL — AB (ref 3.4–10.8)

## 2018-10-08 LAB — LIPID PANEL
Chol/HDL Ratio: 4.6 ratio (ref 0.0–5.0)
Cholesterol, Total: 260 mg/dL — ABNORMAL HIGH (ref 100–199)
HDL: 57 mg/dL (ref >39)
LDL Calculated: 183 mg/dL — ABNORMAL HIGH (ref 0–99)
TRIGLYCERIDES: 100 mg/dL (ref 0–149)
VLDL Cholesterol Cal: 20 mg/dL (ref 5–40)

## 2018-10-25 DIAGNOSIS — K2270 Barrett's esophagus without dysplasia: Secondary | ICD-10-CM | POA: Diagnosis not present

## 2018-10-27 DIAGNOSIS — K2270 Barrett's esophagus without dysplasia: Secondary | ICD-10-CM | POA: Insufficient documentation

## 2018-12-30 ENCOUNTER — Ambulatory Visit: Admit: 2018-12-30 | Payer: Medicare HMO | Admitting: Unknown Physician Specialty

## 2018-12-30 SURGERY — ESOPHAGOGASTRODUODENOSCOPY (EGD) WITH PROPOFOL
Anesthesia: General

## 2019-02-13 ENCOUNTER — Other Ambulatory Visit: Payer: Self-pay | Admitting: Family Medicine

## 2019-02-13 ENCOUNTER — Other Ambulatory Visit: Payer: Self-pay

## 2019-02-13 DIAGNOSIS — M545 Low back pain, unspecified: Secondary | ICD-10-CM

## 2019-02-13 DIAGNOSIS — M25561 Pain in right knee: Secondary | ICD-10-CM

## 2019-02-13 MED ORDER — CYCLOBENZAPRINE HCL 5 MG PO TABS: 5.0000 mg | ORAL_TABLET | Freq: Three times a day (TID) | ORAL | 0 refills | Status: DC | PRN

## 2019-02-13 MED ORDER — NAPROXEN 500 MG PO TBEC: 500.0000 mg | DELAYED_RELEASE_TABLET | Freq: Two times a day (BID) | ORAL | 0 refills | Status: DC

## 2019-02-13 NOTE — Telephone Encounter (Signed)
Patient has been having some issues with his back pain again. He says he has had this in the past. Patient would like a refill on Naproxen and also a prescription for a muscle relaxer. Patient does not want to be seen for an office visit. He says this is the same pain he had in the past.

## 2019-02-13 NOTE — Telephone Encounter (Signed)
Sent medication to the [facility]. Recommend evaluation in the office if no better in 5-7 days with the medications and moist heat applications.

## 2019-04-07 DIAGNOSIS — R69 Illness, unspecified: Secondary | ICD-10-CM | POA: Diagnosis not present

## 2019-06-09 DIAGNOSIS — R69 Illness, unspecified: Secondary | ICD-10-CM | POA: Diagnosis not present

## 2019-06-10 ENCOUNTER — Ambulatory Visit: Payer: Medicare HMO

## 2019-09-15 DIAGNOSIS — R69 Illness, unspecified: Secondary | ICD-10-CM | POA: Diagnosis not present

## 2019-09-30 DIAGNOSIS — Z01818 Encounter for other preprocedural examination: Secondary | ICD-10-CM | POA: Diagnosis not present

## 2019-10-01 ENCOUNTER — Ambulatory Visit: Admit: 2019-10-01 | Payer: Medicare HMO | Admitting: Internal Medicine

## 2019-10-01 SURGERY — ESOPHAGOGASTRODUODENOSCOPY (EGD) WITH PROPOFOL
Anesthesia: General

## 2019-10-03 DIAGNOSIS — K2270 Barrett's esophagus without dysplasia: Secondary | ICD-10-CM | POA: Diagnosis not present

## 2019-10-03 DIAGNOSIS — K2970 Gastritis, unspecified, without bleeding: Secondary | ICD-10-CM | POA: Diagnosis not present

## 2019-10-09 ENCOUNTER — Telehealth: Payer: Self-pay

## 2019-10-09 NOTE — Telephone Encounter (Signed)
Called pt to schedule an AWV and pt declined and stated he was not interested in scheduling this apt. Note made. FYI to PCP!

## 2020-02-23 ENCOUNTER — Encounter: Payer: Self-pay | Admitting: Emergency Medicine

## 2020-02-23 ENCOUNTER — Other Ambulatory Visit: Payer: Self-pay

## 2020-02-23 ENCOUNTER — Ambulatory Visit: Payer: Self-pay | Admitting: *Deleted

## 2020-02-23 ENCOUNTER — Emergency Department
Admission: EM | Admit: 2020-02-23 | Discharge: 2020-02-23 | Disposition: A | Discharge status: Home / Self Care | Payer: Medicare HMO | Attending: Emergency Medicine | Admitting: Emergency Medicine

## 2020-02-23 DIAGNOSIS — R195 Other fecal abnormalities: Secondary | ICD-10-CM | POA: Diagnosis not present

## 2020-02-23 DIAGNOSIS — Z96649 Presence of unspecified artificial hip joint: Secondary | ICD-10-CM | POA: Insufficient documentation

## 2020-02-23 DIAGNOSIS — K922 Gastrointestinal hemorrhage, unspecified: Secondary | ICD-10-CM | POA: Diagnosis not present

## 2020-02-23 DIAGNOSIS — Z87891 Personal history of nicotine dependence: Secondary | ICD-10-CM | POA: Diagnosis not present

## 2020-02-23 DIAGNOSIS — R1010 Upper abdominal pain, unspecified: Secondary | ICD-10-CM | POA: Diagnosis present

## 2020-02-23 DIAGNOSIS — K921 Melena: Secondary | ICD-10-CM | POA: Diagnosis not present

## 2020-02-23 LAB — COMPREHENSIVE METABOLIC PANEL
ALT: 22 U/L (ref 0–44)
AST: 22 U/L (ref 15–41)
Albumin: 4.5 g/dL (ref 3.5–5.0)
Alkaline Phosphatase: 58 U/L (ref 38–126)
Anion gap: 15 (ref 5–15)
BUN: 15 mg/dL (ref 8–23)
CO2: 21 mmol/L — ABNORMAL LOW (ref 22–32)
Calcium: 10 mg/dL (ref 8.9–10.3)
Chloride: 106 mmol/L (ref 98–111)
Creatinine, Ser: 1.34 mg/dL — ABNORMAL HIGH (ref 0.61–1.24)
GFR calc Af Amer: >60 mL/min (ref >60)
GFR calc non Af Amer: 54 mL/min — ABNORMAL LOW (ref >60)
Glucose, Bld: 127 mg/dL — ABNORMAL HIGH (ref 70–99)
Potassium: 4.5 mmol/L (ref 3.5–5.1)
Sodium: 142 mmol/L (ref 135–145)
Total Bilirubin: 1.7 mg/dL — ABNORMAL HIGH (ref 0.3–1.2)
Total Protein: 7.9 g/dL (ref 6.5–8.1)

## 2020-02-23 LAB — URINALYSIS, COMPLETE (UACMP) WITH MICROSCOPIC
Bacteria, UA: NONE SEEN
Bilirubin Urine: NEGATIVE
Glucose, UA: NEGATIVE mg/dL
Hgb urine dipstick: NEGATIVE
Ketones, ur: 20 mg/dL — AB
Leukocytes,Ua: NEGATIVE
Nitrite: NEGATIVE
Protein, ur: NEGATIVE mg/dL
Specific Gravity, Urine: 1.021 (ref 1.005–1.030)
Squamous Epithelial / HPF: NONE SEEN (ref 0–5)
pH: 5 (ref 5.0–8.0)

## 2020-02-23 LAB — CBC
HCT: 44.4 % (ref 39.0–52.0)
Hemoglobin: 15.4 g/dL (ref 13.0–17.0)
MCH: 32.7 pg (ref 26.0–34.0)
MCHC: 34.7 g/dL (ref 30.0–36.0)
MCV: 94.3 fL (ref 80.0–100.0)
Platelets: 326 10*3/uL (ref 150–400)
RBC: 4.71 MIL/uL (ref 4.22–5.81)
RDW: 13.7 % (ref 11.5–15.5)
WBC: 9.6 10*3/uL (ref 4.0–10.5)
nRBC: 0 % (ref 0.0–0.2)

## 2020-02-23 LAB — TYPE AND SCREEN
ABO/RH(D): O POS
Antibody Screen: NEGATIVE

## 2020-02-23 LAB — LIPASE, BLOOD: Lipase: 30 U/L (ref 11–51)

## 2020-02-23 NOTE — ED Notes (Signed)
See triage note  Presents with some intermittent abd pain and some "dark" stools  States the sx's started on Friday  Denies any n/v/ or fever

## 2020-02-23 NOTE — ED Triage Notes (Signed)
Patient presents to the ED with abdominal pain that began Friday and then started noticing black stools after taking kaopectate.  Patient states pain has been intermittent throughout the weekend.  Patient continues to have dark black stools.

## 2020-02-23 NOTE — Telephone Encounter (Signed)
Pt called with complaints of solid black stool since 02/21/20; he started having "stomach problems/pain since 02/20/20; the pt thinks it is due to eating too many peanuts; he says his pain is constant, and is in his lower abdomen; he rates his pain at 1-2 out of 10; the pt says he has taken kaopectate on Fri-Sun (total of 4 times) but stopped when his stool turned black; he had 4 episodes of black stools ( 3 x loose, once solid); recommendations made per nurse triage protocol; he verbalized understanding, and will go to urgent care; the pt sees [employer], [employer]; will route to office for notification.  Reason for Disposition . [1] MILD-MODERATE pain AND [2] constant AND [3] present > 2 hours . [1] MILD-MODERATE pain AND [2] constant and [3] present < 2 hours  Answer Assessment - Initial Assessment Questions 1. LOCATION: "Where does it hurt?"      Lower abdomen 2. RADIATION: "Does the pain shoot anywhere else?" (e.g., chest, back)     no 3. ONSET: "When did the pain begin?" (Minutes, hours or days ago)      01/20/20 4. SUDDEN: "Gradual or sudden onset?"   sudden 5. PATTERN "Does the pain come and go, or is it constant?"    - If constant: "Is it getting better, staying the same, or worsening?"      (Note: Constant means the pain never goes away completely; most serious pain is constant and it progresses)     - If intermittent: "How long does it last?" "Do you have pain now?"     (Note: Intermittent means the pain goes away completely between bouts)     constant 6. SEVERITY: "How bad is the pain?"  (e.g., Scale 1-10; mild, moderate, or severe)    - MILD (1-3): doesn't interfere with normal activities, abdomen soft and not tender to touch     - MODERATE (4-7): interferes with normal activities or awakens from sleep, tender to touch     - SEVERE (8-10): excruciating pain, doubled over, unable to do any normal activities      1-2 out of 10 7. RECURRENT SYMPTOM: "Have you ever had  this type of stomach pain before?" If Yes, ask: "When was the last time?" and "What happened that time?"      Yes, due to ulcers; pt thinks pain is due to eating 3/4 cups of peanuts on afternoon 02/20/20 (cocktail peanuts) 8. CAUSE: "What do you think is causing the stomach pain?"     pt thinks pain is due to eating 3/4 cups of peanuts on afternoon 02/20/20 (cocktail peanuts) 9. RELIEVING/AGGRAVATING FACTORS: "What makes it better or worse?" (e.g., movement, antacids, bowel movement)   kaopectate helped  10. OTHER SYMPTOMS: "Has there been any vomiting, diarrhea, constipation, or urine problems?"       nausea  Protocols used: ABDOMINAL PAIN - MALE-A-AH
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
## 2020-02-23 NOTE — Discharge Instructions (Addendum)
Your test today were all reassuring.  Your vital signs and blood counts are normal.  Your exam showed no signs of blood in the stool, and was otherwise reassuring.  Continue taking your home medications as prescribed and monitor your symptoms.  Seek repeat assessment if you new symptoms such as lightheadedness, fatigue, passing out, chest pain, shortness of breath, or blood in the stool.

## 2020-02-23 NOTE — Telephone Encounter (Signed)
Acknowledged.

## 2020-02-24 ENCOUNTER — Ambulatory Visit: Payer: Medicare HMO | Admitting: Family Medicine

## 2020-03-24 DIAGNOSIS — R69 Illness, unspecified: Secondary | ICD-10-CM | POA: Diagnosis not present

## 2020-07-07 ENCOUNTER — Other Ambulatory Visit: Payer: Self-pay

## 2020-07-07 ENCOUNTER — Ambulatory Visit (INDEPENDENT_AMBULATORY_CARE_PROVIDER_SITE_OTHER): Payer: Medicare HMO

## 2020-07-07 DIAGNOSIS — Z23 Encounter for immunization: Secondary | ICD-10-CM

## 2020-09-20 ENCOUNTER — Telehealth: Payer: Self-pay

## 2020-09-20 NOTE — Telephone Encounter (Signed)
Schedule virtual visit (video preferred, if possible). Infusions are in very short supply and only available to immunocompromised individuals.

## 2020-09-20 NOTE — Telephone Encounter (Signed)
Copied from [place] [id number]. Topic: General - Other >> Sep 20, 2020  8:06 AM Celene Kras wrote: Reason for CRM: Pts wife calling and is requesting to have pt set up with covid infusion clinic. She states that pt tested positive on 09/18/20. Please advise.

## 2020-09-20 NOTE — Telephone Encounter (Signed)
Patient advised . Appointment scheduled for 09/21/2020 at 2pm.

## 2020-09-21 ENCOUNTER — Telehealth: Payer: Self-pay | Admitting: Family Medicine

## 2020-09-21 NOTE — Telephone Encounter (Signed)
Pt cancelled apt saying they decided not to get the infusion.  They went a different rout for treamtent

## 2021-03-29 ENCOUNTER — Telehealth: Payer: Self-pay

## 2021-03-29 NOTE — Telephone Encounter (Signed)
Copied from [place] [id number]. Topic: General - Other >> Mar 28, 2021  4:18 PM Tessa Lerner A wrote: Reason for CRM: The patient has concerns that they may be diabetic and would like to request lab orders for further confirmation   Patient has experienced recent weight loss as well skin irritability that has caused them to be concerned   Please contact further when possible

## 2021-03-29 NOTE — Telephone Encounter (Signed)
Patient advised that he will need to be seen first since it has been 2 years since he has been in
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
## 2021-04-05 ENCOUNTER — Ambulatory Visit (INDEPENDENT_AMBULATORY_CARE_PROVIDER_SITE_OTHER): Payer: Medicare HMO | Admitting: Family Medicine

## 2021-04-05 ENCOUNTER — Other Ambulatory Visit: Payer: Self-pay

## 2021-04-05 ENCOUNTER — Encounter: Payer: Self-pay | Admitting: Family Medicine

## 2021-04-05 VITALS — BP 114/74 | HR 84 | Temp 98.4°F | Wt 230.0 lb

## 2021-04-05 DIAGNOSIS — K209 Esophagitis, unspecified without bleeding: Secondary | ICD-10-CM | POA: Diagnosis not present

## 2021-04-05 DIAGNOSIS — J040 Acute laryngitis: Secondary | ICD-10-CM | POA: Diagnosis not present

## 2021-04-05 DIAGNOSIS — E7800 Pure hypercholesterolemia, unspecified: Secondary | ICD-10-CM

## 2021-04-05 DIAGNOSIS — K2270 Barrett's esophagus without dysplasia: Secondary | ICD-10-CM

## 2021-04-05 DIAGNOSIS — R634 Abnormal weight loss: Secondary | ICD-10-CM

## 2021-04-05 DIAGNOSIS — R7303 Prediabetes: Secondary | ICD-10-CM

## 2021-04-06 ENCOUNTER — Other Ambulatory Visit: Payer: Self-pay

## 2021-04-06 ENCOUNTER — Other Ambulatory Visit: Payer: Self-pay | Admitting: Family Medicine

## 2021-04-06 DIAGNOSIS — E119 Type 2 diabetes mellitus without complications: Secondary | ICD-10-CM

## 2021-04-06 DIAGNOSIS — E1165 Type 2 diabetes mellitus with hyperglycemia: Secondary | ICD-10-CM

## 2021-04-06 LAB — CBC WITH DIFFERENTIAL/PLATELET
Basophils Absolute: 0.1 10*3/uL (ref 0.0–0.2)
Basos: 1 %
EOS (ABSOLUTE): 0.3 10*3/uL (ref 0.0–0.4)
Eos: 4 %
Hematocrit: 43.5 % (ref 37.5–51.0)
Hemoglobin: 15.4 g/dL (ref 13.0–17.7)
Immature Grans (Abs): 0 10*3/uL (ref 0.0–0.1)
Immature Granulocytes: 0 %
Lymphocytes Absolute: 2.3 10*3/uL (ref 0.7–3.1)
Lymphs: 28 %
MCH: 32.5 pg (ref 26.6–33.0)
MCHC: 35.4 g/dL (ref 31.5–35.7)
MCV: 92 fL (ref 79–97)
Monocytes Absolute: 0.7 10*3/uL (ref 0.1–0.9)
Monocytes: 9 %
Neutrophils Absolute: 4.7 10*3/uL (ref 1.4–7.0)
Neutrophils: 58 %
Platelets: 298 10*3/uL (ref 150–450)
RBC: 4.74 x10E6/uL (ref 4.14–5.80)
RDW: 12.1 % (ref 11.6–15.4)
WBC: 8.1 10*3/uL (ref 3.4–10.8)

## 2021-04-06 LAB — LIPID PANEL
Chol/HDL Ratio: 5.8 ratio — ABNORMAL HIGH (ref 0.0–5.0)
Cholesterol, Total: 300 mg/dL — ABNORMAL HIGH (ref 100–199)
HDL: 52 mg/dL (ref >39)
LDL Chol Calc (NIH): 220 mg/dL — ABNORMAL HIGH (ref 0–99)
Triglycerides: 151 mg/dL — ABNORMAL HIGH (ref 0–149)
VLDL Cholesterol Cal: 28 mg/dL (ref 5–40)

## 2021-04-06 LAB — COMPREHENSIVE METABOLIC PANEL
ALT: 20 IU/L (ref 0–44)
AST: 18 IU/L (ref 0–40)
Albumin/Globulin Ratio: 1.9 (ref 1.2–2.2)
Albumin: 4.8 g/dL (ref 3.8–4.8)
Alkaline Phosphatase: 88 IU/L (ref 44–121)
BUN/Creatinine Ratio: 9 — ABNORMAL LOW (ref 10–24)
BUN: 11 mg/dL (ref 8–27)
Bilirubin Total: 1.2 mg/dL (ref 0.0–1.2)
CO2: 19 mmol/L — ABNORMAL LOW (ref 20–29)
Calcium: 10.3 mg/dL — ABNORMAL HIGH (ref 8.6–10.2)
Chloride: 100 mmol/L (ref 96–106)
Creatinine, Ser: 1.28 mg/dL — ABNORMAL HIGH (ref 0.76–1.27)
Globulin, Total: 2.5 g/dL (ref 1.5–4.5)
Glucose: 295 mg/dL — ABNORMAL HIGH (ref 65–99)
Potassium: 4.9 mmol/L (ref 3.5–5.2)
Sodium: 138 mmol/L (ref 134–144)
Total Protein: 7.3 g/dL (ref 6.0–8.5)
eGFR: 60 mL/min/{1.73_m2} (ref >59)

## 2021-04-06 LAB — TSH: TSH: 2.49 u[IU]/mL (ref 0.450–4.500)

## 2021-04-06 LAB — HEMOGLOBIN A1C
Est. average glucose Bld gHb Est-mCnc: 367 mg/dL
Hgb A1c MFr Bld: 14.4 % — ABNORMAL HIGH (ref 4.8–5.6)

## 2021-04-06 MED ORDER — SYNJARDY 5-1000 MG PO TABS
1.0000 | ORAL_TABLET | Freq: Every day | ORAL | Status: DC
Start: 2021-04-06 — End: 2021-04-26

## 2021-04-11 ENCOUNTER — Telehealth: Payer: Self-pay

## 2021-04-11 NOTE — Telephone Encounter (Signed)
Patient advised that referral department was working on the referrals.

## 2021-04-11 NOTE — Telephone Encounter (Signed)
Copied from [place] [phone]. Topic: General - Call Back - No Documentation >> Apr 11, 2021 10:33 AM Erick Blinks wrote: Requesting to speak with clinic regarding his diabetes plan of care.  Best contact: [phone]

## 2021-04-19 ENCOUNTER — Encounter: Payer: Self-pay | Admitting: *Deleted

## 2021-04-19 ENCOUNTER — Encounter: Payer: Medicare HMO | Attending: Family Medicine | Admitting: *Deleted

## 2021-04-19 ENCOUNTER — Other Ambulatory Visit: Payer: Self-pay

## 2021-04-19 VITALS — BP 114/82 | Ht 70.0 in | Wt 223.4 lb

## 2021-04-19 DIAGNOSIS — Z713 Dietary counseling and surveillance: Secondary | ICD-10-CM | POA: Diagnosis not present

## 2021-04-19 DIAGNOSIS — E119 Type 2 diabetes mellitus without complications: Secondary | ICD-10-CM

## 2021-04-19 DIAGNOSIS — E1165 Type 2 diabetes mellitus with hyperglycemia: Secondary | ICD-10-CM | POA: Insufficient documentation

## 2021-04-19 NOTE — Progress Notes (Signed)
Diabetes Self-Management Education  Visit Type: First/Initial  Appt. Start Time: 0905 Appt. End Time: 1020  04/19/2021  [name], identified by name and date of birth, is a 70 y.o. male with a diagnosis of Diabetes: Type 2.   ASSESSMENT  Blood pressure 114/82, height '5\' 10"'$  (1.778 m), weight 223 lb 6.4 oz (101.3 kg). Body mass index is 32.05 kg/m.   Diabetes Self-Management Education - 04/19/21 1438       Visit Information   Visit Type First/Initial      Initial Visit   Diabetes Type Type 2    Are you currently following a meal plan? Yes    What type of meal plan do you follow? "stopped sugar, reduced bread intake"    Are you taking your medications as prescribed? Yes    Date Diagnosed 2 weeks ago      Health Coping   How would you rate your overall health? Fair      Psychosocial Assessment   Patient Belief/Attitude about Diabetes Motivated to manage diabetes    Self-care barriers None    Self-management support Doctor's office;Family    Other persons present Spouse/SO    Patient Concerns Nutrition/Meal planning;Glycemic Control;Weight Control;Monitoring;Healthy Lifestyle    Special Needs None    Preferred Learning Style Auditory    Learning Readiness Change in progress    How often do you need to have someone help you when you read instructions, pamphlets, or other written materials from your doctor or pharmacy? 1 - Never    What is the last grade level you completed in school? 12th      Pre-Education Assessment   Patient understands the diabetes disease and treatment process. Needs Instruction    Patient understands incorporating nutritional management into lifestyle. Needs Instruction    Patient undertands incorporating physical activity into lifestyle. Needs Instruction    Patient understands using medications safely. Needs Instruction    Patient understands monitoring blood glucose, interpreting and using results Needs Instruction    Patient understands  prevention, detection, and treatment of acute complications. Needs Instruction    Patient understands prevention, detection, and treatment of chronic complications. Needs Instruction    Patient understands how to develop strategies to address psychosocial issues. Needs Instruction    Patient understands how to develop strategies to promote health/change behavior. Needs Instruction      Complications   Last HgB A1C per patient/outside source 14.4 %   04/05/2021   How often do you check your blood sugar? Patient declines   Blood sugar in the office was 167 mg/dL at 10:05 am - 4 hrs pp.   Have you had a dilated eye exam in the past 12 months? No    Have you had a dental exam in the past 12 months? Yes    Are you checking your feet? No      Dietary Intake   Breakfast oatmeal square - 24 grams carbs or cereal and milk    Lunch 1/2 sandwich on wheat bread - [place], ham, pimento cheese    Snack (afternoon) sometimes nuts    Dinner chicken nuggets, beef, occasional fish and pork; peas, beans, pasta, green beans; salads with egg, tomato, cuccumberes, pickles, lettuce, onion, Ranch dressing    Snack (evening) popcorn    Beverage(s) water, unsweetened tea, coffee with SF creamer      Exercise   Exercise Type ADL's      Patient Education   Previous Diabetes Education No    Disease  state  Definition of diabetes, type 1 and 2, and the diagnosis of diabetes;Factors that contribute to the development of diabetes    Nutrition management  Role of diet in the treatment of diabetes and the relationship between the three main macronutrients and blood glucose level;Food label reading, portion sizes and measuring food.;Reviewed blood glucose goals for pre and post meals and how to evaluate the patients' food intake on their blood glucose level.    Physical activity and exercise  Role of exercise on diabetes management, blood pressure control and cardiac health.    Medications Reviewed patients medication for  diabetes, action, purpose, timing of dose and side effects.    Monitoring Purpose and frequency of SMBG.;Taught/discussed recording of test results and interpretation of SMBG.;Identified appropriate SMBG and/or A1C goals.    Chronic complications Relationship between chronic complications and blood glucose control    Psychosocial adjustment Identified and addressed patients feelings and concerns about diabetes      Individualized Goals (developed by patient)   Reducing Risk Other (comment)   improve blood sugars, prevent diabetes complications, lose weight, lead a healthier lifestyle     Outcomes   Expected Outcomes Demonstrated interest in learning. Expect positive outcomes    Future DMSE 4-6 wks             Individualized Plan for Diabetes Self-Management Training:   Learning Objective:  Patient will have a greater understanding of diabetes self-management. Patient education plan is to attend individual and/or group sessions per assessed needs and concerns.   Plan:   Patient Instructions  Exercise:  Begin exercise bike  for    15  minutes   3  days a week and gradually increase to 30 minutes 5 x week  Eat 3 meals day,   1-2  snacks a day Space meals 4-6 hours apart Limit fried foods Complete 3 Day Food Record and bring to next appt  Make an eye doctor appointment  Return for appointment on:  Wednesday May 18, 2021 at 8:30 am with Freda Munro (nurse)   Expected Outcomes:  Demonstrated interest in learning. Expect positive outcomes  Education material provided:  General Meal Planning Guidelines Simple Meal Plan 3 Day Food Record  If problems or questions, patient to contact team via:   Johny Drilling, RN, [place] [phone]  Future DSME appointment: 4-6 wks May 18, 2021 with this nurse

## 2021-04-19 NOTE — Patient Instructions (Signed)
Exercise:  Begin exercise bike  for    15  minutes   3  days a week and gradually increase to 30 minutes 5 x week  Eat 3 meals day,   1-2  snacks a day Space meals 4-6 hours apart Limit fried foods Complete 3 Day Food Record and bring to next appt  Make an eye doctor appointment  Return for appointment on:  Wednesday May 18, 2021 at 8:30 am with Freda Munro (nurse)

## 2021-04-26 ENCOUNTER — Other Ambulatory Visit: Payer: Self-pay

## 2021-04-26 ENCOUNTER — Encounter: Payer: Self-pay | Admitting: Family Medicine

## 2021-04-26 ENCOUNTER — Ambulatory Visit (INDEPENDENT_AMBULATORY_CARE_PROVIDER_SITE_OTHER): Payer: Medicare HMO | Admitting: Family Medicine

## 2021-04-26 VITALS — BP 118/72 | HR 81 | Temp 98.4°F | Resp 16 | Wt 226.0 lb

## 2021-04-26 DIAGNOSIS — E7800 Pure hypercholesterolemia, unspecified: Secondary | ICD-10-CM

## 2021-04-26 DIAGNOSIS — K2270 Barrett's esophagus without dysplasia: Secondary | ICD-10-CM

## 2021-04-26 DIAGNOSIS — E1165 Type 2 diabetes mellitus with hyperglycemia: Secondary | ICD-10-CM

## 2021-04-26 DIAGNOSIS — K209 Esophagitis, unspecified without bleeding: Secondary | ICD-10-CM

## 2021-04-26 MED ORDER — SYNJARDY 5-1000 MG PO TABS: 1.0000 | ORAL_TABLET | Freq: Every day | ORAL | 3 refills | Status: DC

## 2021-04-26 MED ORDER — LISINOPRIL 2.5 MG PO TABS: 2.5000 mg | ORAL_TABLET | Freq: Every day | ORAL | 3 refills | Status: DC

## 2021-04-26 MED ORDER — ATORVASTATIN CALCIUM 20 MG PO TABS: 20.0000 mg | ORAL_TABLET | Freq: Every day | ORAL | 3 refills | Status: AC

## 2021-04-26 NOTE — Progress Notes (Signed)
Established patient visit   Patient: Kevin Mccullough   DOB: Jul 22, 1951   70 y.o. Male  MRN: [id number] Visit Date: 04/26/2021  Today's healthcare provider: Vernie Murders, PA-C   Chief Complaint  Patient presents with   Diabetes    Subjective  -------------------------------------------------------------------------------------------------------------------- HPI  Diabetes Mellitus Type II, Follow-up  Lab Results  Component Value Date   HGBA1C 14.4 (H) 04/05/2021   HGBA1C 6.2 (H) 07/02/2018   HGBA1C 6.1 (H) 06/04/2017   Wt Readings from Last 3 Encounters:  04/26/21 226 lb (102.5 kg)  04/19/21 223 lb 6.4 oz (101.3 kg)  04/05/21 230 lb (104.3 kg)   Last seen for prediabetes 04/05/2021. During that visit labs were ordered and his A1C was 14.4. Patient was advised that Endocrinology referral was needed to get Diabetes under control. He reports good compliance with treatment. Since last visit ptient has been seen by a nutritionist. He states he has an appointment with Endocrinology on 05/25/2021.  He is not having side effects.    Home blood sugar records:  blood sugars are not checked  Episodes of hypoglycemia? No    Current insulin regiment: none Most Recent Eye Exam: 1 year ago Current exercise: stationary bike Current diet habits: in general, a "healthy" diet    Pertinent Labs: Lab Results  Component Value Date   CHOL 300 (H) 04/05/2021   HDL 52 04/05/2021   LDLCALC 220 (H) 04/05/2021   TRIG 151 (H) 04/05/2021   CHOLHDL 5.8 (H) 04/05/2021   Lab Results  Component Value Date   NA 138 04/05/2021   K 4.9 04/05/2021   CREATININE 1.28 (H) 04/05/2021   GFRNONAA 54 (L) 02/23/2020   GFRAA >60 02/23/2020   GLUCOSE 295 (H) 04/05/2021     ---------------------------------------------------------------------------------------------------   Past Medical History:  Diagnosis Date   Arthritis    Complication of anesthesia    PT WAS AWAKE DURING THE BEGINNING  OF HIS SPLENECTOMY-WAS TOLD THAT THE SURGERY STARTED TO SOON AND ANESTHESIA HAD NOT FULLY KICKED IN    Diabetes mellitus without complication ([place])    Hepatitis    History of bleeding ulcers    History of kidney stones    Past Surgical History:  Procedure Laterality Date   ARTHROPLASTY Left 2003   BACK SURGERY  1983,1991,2010,2011   Spinal Fusion in 2011   CHOLECYSTECTOMY N/A 09/21/2016   Procedure: LAPAROSCOPIC CHOLECYSTECTOMY WITH INTRAOPERATIVE CHOLANGIOGRAM;  Surgeon: Robert Bellow, MD;  Location: [facility] ORS;  Service: General;  Laterality: N/A;   COLONOSCOPY WITH PROPOFOL N/A 10/28/2015   Procedure: COLONOSCOPY WITH PROPOFOL;  Surgeon: Manya Silvas, MD;  Location: [facility] ENDOSCOPY;  Service: Endoscopy;  Laterality: N/A;   ENDOSCOPIC RETROGRADE CHOLANGIOPANCREATOGRAPHY (ERCP) WITH PROPOFOL N/A 09/22/2016   Procedure: ENDOSCOPIC RETROGRADE CHOLANGIOPANCREATOGRAPHY (ERCP) WITH PROPOFOL;  Surgeon: Lucilla Lame, MD;  Location: [facility] ENDOSCOPY;  Service: Endoscopy;  Laterality: N/A;   ESOPHAGOGASTRODUODENOSCOPY (EGD) WITH PROPOFOL N/A 10/28/2015   Procedure: ESOPHAGOGASTRODUODENOSCOPY (EGD) WITH PROPOFOL;  Surgeon: Manya Silvas, MD;  Location: [facility] ENDOSCOPY;  Service: Endoscopy;  Laterality: N/A;   HERNIA REPAIR     INGUINAL HERNIA REPAIR Left 06/01/2015   Procedure: HERNIA REPAIR INGUINAL ADULT;  Surgeon: Leonie Green, MD;  Location: [facility] ORS;  Service: General;  Laterality: Left;   JOINT REPLACEMENT Left 2003   Partial Hip Replacement   SHOULDER SURGERY Left    SPLENECTOMY  1983   [place]   Social History   Tobacco Use  Smoking status: Former    Packs/day: 1.00    Years: 17.00    Pack years: 17.00    Types: Cigarettes    Quit date: 11/27/1987    Years since quitting: 33.4   Smokeless tobacco: Never   Tobacco comments:    [place]  Substance Use Topics   Alcohol use: No    Alcohol/week: 0.0 standard drinks   Drug use: No   Family Status   Relation Name Status   Mother  Deceased at age 30   Father  Deceased at age 98   Sister 27 Alive   Daughter 73 Alive   Sister 2 Alive   Sister 3 Alive   Daughter 2 Alive   Allergies  Allergen Reactions   Naproxen Other (See Comments)    STOMACH PAIN      Medications: Outpatient Medications Prior to Visit  Medication Sig   aspirin EC 81 MG tablet Take 81 mg by mouth daily.   Biotin 10000 MCG TABS Take 10,000 mcg by mouth daily.   Cholecalciferol (VITAMIN D3) 2000 UNITS TABS Take 2,000 Units by mouth daily.    Coenzyme Q10 (CO Q 10 PO) Take 1 tablet by mouth daily.   Empagliflozin-metFORMIN HCl (SYNJARDY) 12-998 MG TABS Take 1 tablet by mouth daily.   Multiple Vitamin (MULTIVITAMIN WITH MINERALS) TABS tablet Take 1 tablet by mouth daily.   Omega-3 350 MG CPDR Take 350 mg by mouth daily.   Red Yeast Rice Extract (RED YEAST RICE PO) Take 2 capsules by mouth daily.   No facility-administered medications prior to visit.    Review of Systems  Last CBC Lab Results  Component Value Date   WBC 8.1 04/05/2021   HGB 15.4 04/05/2021   HCT 43.5 04/05/2021   MCV 92 04/05/2021   MCH 32.5 04/05/2021   RDW 12.1 04/05/2021   PLT 298 [id number]   Last metabolic panel Lab Results  Component Value Date   GLUCOSE 295 (H) 04/05/2021   NA 138 04/05/2021   K 4.9 04/05/2021   CL 100 04/05/2021   CO2 19 (L) 04/05/2021   BUN 11 04/05/2021   CREATININE 1.28 (H) 04/05/2021   GFRNONAA 54 (L) 02/23/2020   GFRAA >60 02/23/2020   CALCIUM 10.3 (H) 04/05/2021   PROT 7.3 04/05/2021   ALBUMIN 4.8 04/05/2021   LABGLOB 2.5 04/05/2021   AGRATIO 1.9 04/05/2021   BILITOT 1.2 04/05/2021   ALKPHOS 88 04/05/2021   AST 18 04/05/2021   ALT 20 04/05/2021   ANIONGAP 15 02/23/2020   Last lipids Lab Results  Component Value Date   CHOL 300 (H) 04/05/2021   HDL 52 04/05/2021   LDLCALC 220 (H) 04/05/2021   TRIG 151 (H) 04/05/2021   CHOLHDL 5.8 (H) 04/05/2021   Last hemoglobin A1c Lab Results   Component Value Date   HGBA1C 14.4 (H) 04/05/2021   Last thyroid functions Lab Results  Component Value Date   TSH 2.490 04/05/2021       Objective  -------------------------------------------------------------------------------------------------------------------- BP 118/72 (BP Location: Right Arm, Patient Position: Sitting, Cuff Size: Normal)   Pulse 81   Temp 98.4 F (36.9 C) (Temporal)   Resp 16   Wt 226 lb (102.5 kg)   SpO2 99% Comment: room air  BMI 32.43 kg/m  Wt Readings from Last 3 Encounters:  04/26/21 226 lb (102.5 kg)  04/19/21 223 lb 6.4 oz (101.3 kg)  04/05/21 230 lb (104.3 kg)    Physical Exam Constitutional:  General: He is not in acute distress.    Appearance: He is well-developed.  HENT:     Head: Normocephalic and atraumatic.     Right Ear: Hearing normal.     Left Ear: Hearing normal.     Nose: Nose normal.  Eyes:     General: Lids are normal. No scleral icterus.       Right eye: No discharge.        Left eye: No discharge.     Conjunctiva/sclera: Conjunctivae normal.  Cardiovascular:     Rate and Rhythm: Normal rate and regular rhythm.     Pulses: Normal pulses.     Heart sounds: Normal heart sounds.  Pulmonary:     Effort: Pulmonary effort is normal. No respiratory distress.     Breath sounds: Normal breath sounds.  Abdominal:     General: Bowel sounds are normal.     Palpations: Abdomen is soft.  Musculoskeletal:        General: Normal range of motion.     Cervical back: Neck supple.  Skin:    Findings: No lesion or rash.  Neurological:     Mental Status: He is alert and oriented to person, place, and time.  Psychiatric:        Speech: Speech normal.        Behavior: Behavior normal.        Thought Content: Thought content normal.    Diabetic Foot Form - Detailed   Diabetic Foot Exam - detailed Diabetic Foot exam was performed with the following findings: Yes 04/26/2021  5:19 PM  Visual Foot Exam completed.: Yes  Can the  patient see the bottom of their feet?: No Are the shoes appropriate in style and fit?: Yes Is there swelling or and abnormal foot shape?: No Is there a claw toe deformity?: No Is there elevated skin temparature?: No Is there foot or ankle muscle weakness?: No Normal Range of Motion: Yes Pulse Foot Exam completed.: Yes   Right posterior Tibialias: Present Left posterior Tibialias: Present   Right Dorsalis Pedis: Present Left Dorsalis Pedis: Present  Semmes-Weinstein Monofilament Test R Site 1-Great Toe: Pos L Site 1-Great Toe: Pos           No results found for any visits on 04/26/21.  Assessment & Plan  ---------------------------------------------------------------------------------------------------------------------- 1. Uncontrolled type 2 diabetes mellitus with hyperglycemia (HCC) Tolerating the Synjardy without hypoglycemic symptoms. Has lost 4 lbs since the first of the month and feeling more energetic. Encouraged by the dietitian education about diet. Will see endocrinologist and consider starting testing FBS at home at the next diabetic education class. Wants to check on cost of the Synjardy, start the Atorvastatin and Lisinopril. Recheck prn pending endocrinology referral with Dr. Gabriel Carina. - Empagliflozin-metFORMIN HCl (SYNJARDY) 12-998 MG TABS; Take 1 tablet by mouth daily.  Dispense: 30 tablet; Refill: 3 - atorvastatin (LIPITOR) 20 MG tablet; Take 1 tablet (20 mg total) by mouth daily.  Dispense: 90 tablet; Refill: 3 - lisinopril (ZESTRIL) 2.5 MG tablet; Take 1 tablet (2.5 mg total) by mouth daily.  Dispense: 90 tablet; Refill: 3  2. Hypercholesterolemia without hypertriglyceridemia Very high total cholesterol and LDL. Will start Atorvastatin. Feels the Red Yeast Rice and Co-Q 10 has been working without side effects. Continue low fat diet and regular exercise. Stop Red Yeast Rice if he starts taking the Atorvastatin. - atorvastatin (LIPITOR) 20 MG tablet; Take 1 tablet (20  mg total) by mouth daily.  Dispense: 90 tablet; Refill: 3  3. Barrett's esophagus with esophagitis Asymptomatic now. No hematemesis or melena.    No follow-ups on file.         Vernie Murders, PA-C  [employer] [phone] (phone) [phone] (fax)  [place]

## 2021-04-28 DIAGNOSIS — R490 Dysphonia: Secondary | ICD-10-CM | POA: Diagnosis not present

## 2021-04-28 DIAGNOSIS — K219 Gastro-esophageal reflux disease without esophagitis: Secondary | ICD-10-CM | POA: Diagnosis not present

## 2021-05-16 DIAGNOSIS — E119 Type 2 diabetes mellitus without complications: Secondary | ICD-10-CM | POA: Diagnosis not present

## 2021-05-18 ENCOUNTER — Other Ambulatory Visit: Payer: Self-pay

## 2021-05-18 ENCOUNTER — Encounter: Payer: Medicare HMO | Attending: Family Medicine | Admitting: *Deleted

## 2021-05-18 ENCOUNTER — Telehealth: Payer: Self-pay

## 2021-05-18 ENCOUNTER — Encounter: Payer: Self-pay | Admitting: *Deleted

## 2021-05-18 VITALS — BP 112/70 | Wt 219.8 lb

## 2021-05-18 DIAGNOSIS — E118 Type 2 diabetes mellitus with unspecified complications: Secondary | ICD-10-CM | POA: Diagnosis not present

## 2021-05-18 DIAGNOSIS — E1165 Type 2 diabetes mellitus with hyperglycemia: Secondary | ICD-10-CM

## 2021-05-18 DIAGNOSIS — E119 Type 2 diabetes mellitus without complications: Secondary | ICD-10-CM

## 2021-05-18 NOTE — Progress Notes (Signed)
Diabetes Self-Management Education  Visit Type: Follow-up  Appt. Start Time: 0825 Appt. End Time: 0940  05/18/2021  [name], identified by name and date of birth, is a 70 y.o. male with a diagnosis of Diabetes: Type 2.   ASSESSMENT  Blood pressure 112/70, weight 219 lb 12.8 oz (99.7 kg). Body mass index is 31.54 kg/m.   Diabetes Self-Management Education - 05/18/21 0958       Visit Information   Visit Type Follow-up      Initial Visit   Diabetes Type Type 2      Complications   How often do you check your blood sugar? 1-2 times/day   He had a meter that his daughter gave him and has been checking his blood sugars. Provided One Touch Verio Reflect meter and instructed on use. BG upon return demonstration was 136 mg/dL at 9:15 am - 4 hrs pp.   Fasting Blood glucose range (mg/dL) 70-129   FBG's 74-103 mg/dL   Postprandial Blood glucose range (mg/dL) 70-129 pp's 88-122 mg/dL   Have you had a dilated eye exam in the past 12 months? Yes    Have you had a dental exam in the past 12 months? Yes    Are you checking your feet? Yes    How many days per week are you checking your feet? 1      Dietary Intake   Breakfast eating 3 meals and 1-2 snacks/day - see 3 Day Food Record      Exercise   Exercise Type Moderate (swimming / aerobic walking)   stationary bike   How many days per week to you exercise? 4.5    How many minutes per day do you exercise? 45    Total minutes per week of exercise 202.5      Patient Education   Disease state  Definition of diabetes, type 1 and 2, and the diagnosis of diabetes;Factors that contribute to the development of diabetes    Nutrition management  Role of diet in the treatment of diabetes and the relationship between the three main macronutrients and blood glucose level;Food label reading, portion sizes and measuring food.;Reviewed blood glucose goals for pre and post meals and how to evaluate the patients' food intake on their blood glucose  level.    Physical activity and exercise  Role of exercise on diabetes management, blood pressure control and cardiac health.    Medications Reviewed patients medication for diabetes, action, purpose, timing of dose and side effects.    Monitoring Taught/evaluated SMBG meter.;Purpose and frequency of SMBG.;Taught/discussed recording of test results and interpretation of SMBG.;Identified appropriate SMBG and/or A1C goals.    Acute complications Taught treatment of hypoglycemia - the 15 rule.    Chronic complications Relationship between chronic complications and blood glucose control    Psychosocial adjustment Identified and addressed patients feelings and concerns about diabetes      Individualized Goals (developed by patient)   Nutrition Follow meal plan discussed    Physical Activity Exercise 3-5 times per week;45 minutes per day    Medications take my medication as prescribed    Monitoring  test my blood glucose as discussed   take glucometer to endo appointments     Post-Education Assessment   Patient understands the diabetes disease and treatment process. Demonstrates understanding / competency    Patient understands incorporating nutritional management into lifestyle. Demonstrates understanding / competency    Patient undertands incorporating physical activity into lifestyle. Demonstrates understanding / competency  Patient understands using medications safely. Demonstrates understanding / competency    Patient understands monitoring blood glucose, interpreting and using results Demonstrates understanding / competency    Patient understands prevention, detection, and treatment of acute complications. Needs Review    Patient understands prevention, detection, and treatment of chronic complications. Demonstrates understanding / competency    Patient understands how to develop strategies to address psychosocial issues. Demonstrates understanding / competency    Patient understands how to  develop strategies to promote health/change behavior. Demonstrates understanding / competency      Outcomes   Expected Outcomes Demonstrated interest in learning. Expect positive outcomes    Future DMSE PRN    Program Status Completed      Subsequent Visit   Since your last visit have you continued or begun to take your medications as prescribed? Yes    Since your last visit have you had your blood pressure checked? Yes    Is your most recent blood pressure lower, unchanged, or higher since your last visit? Unchanged    Since your last visit have you experienced any weight changes? Loss    Weight Loss (lbs) 3.6    Since your last visit, are you checking your blood glucose at least once a day? Yes        Individualized Plan for Diabetes Self-Management Training:   Learning Objective:  Patient will have a greater understanding of diabetes self-management. Patient education plan is to attend individual and/or group sessions per assessed needs and concerns.   Plan:   Patient Instructions  Check blood sugars before breakfast or 2 hrs after one meal - 3 x week Bring blood sugar records to the next MD appointment  Call your doctor for a prescription for:  1. Meter strips (type) One Touch Verio  checking  3   times per week  2. Lancets (type) One Touch Delica Plus checking  3   times per week  Exercise:  Continue program  for    45  minutes   4-5  days a week  Eat 3 meals day,   1-2  snacks a day Space meals 4-6 hours apart Allow 2-3 hours between meal and snacks Continue to limit fried foods to 1-2 x week  Call for any questions  Expected Outcomes:  Demonstrated interest in learning. Expect positive outcomes  Education material provided:  Planning a Balanced Meal  If problems or questions, patient to contact team via:   Johny Drilling, RN, [place], [place] [phone]  Future DSME appointment: PRN

## 2021-05-18 NOTE — Telephone Encounter (Signed)
Copied from [place] [id number]. Topic: General - Other >> May 18, 2021 12:34 PM Loma [place] wrote:  Pt has meet with nutritionest today states he has been advised to monitor blood glucose.At present he has been told just needs to check 3 times a week Pt has  a "One Touch Verio Meter Reflect "   PT needs just strips for the meter,  One Touch Verio strips,  also needs lancets, One Touch Delica Plus .  Pt wants called into [place] [place] [facility] [address] [place] [address] Phone: [phone] Fax: [phone] Pt just had a Diabetic FU with [facility] on 04/26/21

## 2021-05-18 NOTE — Patient Instructions (Addendum)
Check blood sugars before breakfast or 2 hrs after one meal - 3 x week Bring blood sugar records to the next MD appointment  Call your doctor for a prescription for:  1. Meter strips (type) One Touch Verio  checking  3   times per week  2. Lancets (type) One Touch Delica Plus checking  3   times per week  Exercise:  Continue program  for    45  minutes   4-5  days a week  Eat 3 meals day,   1-2  snacks a day Space meals 4-6 hours apart Allow 2-3 hours between meal and snacks Continue to limit fried foods to 1-2 x week  Call for any questions

## 2021-05-19 ENCOUNTER — Telehealth: Payer: Self-pay

## 2021-05-19 MED ORDER — ONETOUCH ULTRASOFT LANCETS MISC: 12 refills | Status: AC

## 2021-05-19 MED ORDER — GLUCOSE BLOOD VI STRP: ORAL_STRIP | 12 refills | Status: AC

## 2021-05-19 NOTE — Telephone Encounter (Signed)
Copied from [place] [id number]. Topic: General - Other >> May 19, 2021 12:59 PM Leward Quan A wrote: Reason for CRM: Bonnielee Haff with patient insurance [employer] called in to inform PCP that patient with [facility] can get 100 day fill on his tier one medications such as his lisinopril (ZESTRIL) 2.5 MG tablet, atorvastatin (LIPITOR) 20 MG tablet can be reached with questions and concerns at Ph#  [phone]

## 2021-05-24 ENCOUNTER — Ambulatory Visit: Payer: Self-pay | Admitting: *Deleted

## 2021-05-24 NOTE — Telephone Encounter (Deleted)
Patient reported he has rash- Sunday- patient has started some new medication-9/2.

## 2021-05-24 NOTE — Telephone Encounter (Signed)
Call to patient- patient reports he has developed a rash- started Sunday- bilateral elbows to upper arm, left knee and hand. Patient states he has had mild itching and bumps under the skin with areas of pink. Patient states he started new medications at the beginning of the month and wonders if he could be having SE. Advised noramlly reactions start soon after new medications are introduced but- some of his medications do list dermatologic symptoms. Patient has been given appointment this week for evaluation.   Summary: medication reaction   Pt has developed a rash on his arms and around knees / he stated this started Sunday and he thinks they are reaction to medications he has started taking / he mentioned Empagliflozin-metFORMIN HCl (SYNJARDY) 12-998 MG TABS, atorvastatin (LIPITOR) and lisinopril (ZESTRIL) 2.5 MG tablet/ please advise      Reason for Disposition  Mild widespread rash  Answer Assessment - Initial Assessment Questions 1. APPEARANCE of RASH: "Describe the rash." (e.g., spots, blisters, raised areas, skin peeling, scaly)     Splottchy, red, wide, bumps 2. SIZE: "How big are the spots?" (e.g., tip of pen, eraser, coin; inches, centimeters)     Bumps small in size- under surface of the skin 3. LOCATION: "Where is the rash located?"     Elbow-upper arm- bilateral, knee- left , left hand-just started 4. COLOR: "What color is the rash?" (Note: It is difficult to assess rash color in people with darker-colored skin. When this situation occurs, simply ask the caller to describe what they see.)     pinkish 5. ONSET: "When did the rash begin?"     Sunday 6. FEVER: "Do you have a fever?" If Yes, ask: "What is your temperature, how was it measured, and when did it start?"     no 7. ITCHING: "Does the rash itch?" If Yes, ask: "How bad is the itch?" (Scale 1-10; or mild, moderate, severe)     Yes- mild 8. CAUSE: "What do you think is causing the rash?"     unsure 9. MEDICINE FACTORS:  "Have you started any new medicines within the last 2 weeks?" (e.g., antibiotics)      9/2- started new medications 10. OTHER SYMPTOMS: "Do you have any other symptoms?" (e.g., dizziness, headache, sore throat, joint pain)       no 11. PREGNANCY: "Is there any chance you are pregnant?" "When was your last menstrual period?"       N/a  Protocols used: Rash or Redness - [facility]

## 2021-05-25 DIAGNOSIS — E1169 Type 2 diabetes mellitus with other specified complication: Secondary | ICD-10-CM | POA: Diagnosis not present

## 2021-05-25 DIAGNOSIS — E782 Mixed hyperlipidemia: Secondary | ICD-10-CM | POA: Diagnosis not present

## 2021-05-26 ENCOUNTER — Encounter: Payer: Self-pay | Admitting: Family Medicine

## 2021-05-26 ENCOUNTER — Other Ambulatory Visit: Payer: Self-pay

## 2021-05-26 ENCOUNTER — Ambulatory Visit (INDEPENDENT_AMBULATORY_CARE_PROVIDER_SITE_OTHER): Payer: Medicare HMO | Admitting: Family Medicine

## 2021-05-26 VITALS — BP 114/76 | HR 101 | Temp 98.1°F | Ht 70.0 in | Wt 218.5 lb

## 2021-05-26 DIAGNOSIS — L270 Generalized skin eruption due to drugs and medicaments taken internally: Secondary | ICD-10-CM

## 2021-05-26 DIAGNOSIS — L301 Dyshidrosis [pompholyx]: Secondary | ICD-10-CM | POA: Diagnosis not present

## 2021-05-26 DIAGNOSIS — T783XXA Angioneurotic edema, initial encounter: Secondary | ICD-10-CM

## 2021-05-26 MED ORDER — PREDNISONE 20 MG PO TABS
40.0000 mg | ORAL_TABLET | Freq: Every day | ORAL | 0 refills | Status: AC
Start: 2021-05-26 — End: 2021-06-02

## 2021-05-26 NOTE — Assessment & Plan Note (Signed)
-   Acute, uncomplicated dyshidrotic eczema on bilateral hands - Counseled pt. to use unscented moisturizers or emollients prn for skin irritation

## 2021-05-26 NOTE — Progress Notes (Signed)
Established patient visit   Patient: Kevin Mccullough   DOB: April 18, 1951   70 y.o. Male  MRN: [id number] Visit Date: 05/26/2021  Today's healthcare provider: Lavon Paganini, MD   Chief Complaint  Patient presents with   Rash   Subjective    Rash - Started Sunday; first noticed on bilateral elbows, then spread to all 4 extremities & trunk - Pt. describes rash as "mildly itchy" but not painful - Pt. states that he's been taking Benadryl q6h, which hasn't helped to resolve rash - States that his lower lip seemed swollen on Tuesday, but this swelling eventually self-resolved - Endorses some mild diarrhea, but notes that this is a chronic, intermittent concern for him - Denies fatigue, fever, joint pain, URI symptoms - Denies recent introductions of new soaps/detergents/clothes/foods & outdoor exposures - He started Lisinopril on 04/29/21; he decided not to take the medication this morning due to concerns about rash   Medications: Outpatient Medications Prior to Visit  Medication Sig   aspirin EC 81 MG tablet Take 81 mg by mouth daily.   atorvastatin (LIPITOR) 20 MG tablet Take 1 tablet (20 mg total) by mouth daily.   Biotin 10000 MCG TABS Take 10,000 mcg by mouth daily.   Cholecalciferol (VITAMIN D3) 2000 UNITS TABS Take 2,000 Units by mouth daily.    Coenzyme Q10 (CO Q 10 PO) Take 1 tablet by mouth daily.   Empagliflozin-metFORMIN HCl (SYNJARDY) 12-998 MG TABS Take 1 tablet by mouth daily.   glucose blood test strip Check blood sugar 3 times a week   Lancets (ONETOUCH ULTRASOFT) lancets To check blood sugar 3 times a week   Multiple Vitamin (MULTIVITAMIN WITH MINERALS) TABS tablet Take 1 tablet by mouth daily.   Omega-3 350 MG CPDR Take 350 mg by mouth daily.   Red Yeast Rice Extract (RED YEAST RICE PO) Take 2 capsules by mouth daily.   [DISCONTINUED] lisinopril (ZESTRIL) 2.5 MG tablet Take 1 tablet (2.5 mg total) by mouth daily.   No facility-administered medications  prior to visit.    Review of Systems  Constitutional:  Negative for activity change, appetite change, fatigue and fever.  HENT: Negative.    Eyes: Negative.   Respiratory:  Negative for chest tightness and shortness of breath.   Cardiovascular:  Negative for chest pain and leg swelling.  Gastrointestinal:  Positive for diarrhea.  Endocrine: Negative.   Genitourinary: Negative.   Musculoskeletal: Negative.  Negative for arthralgias.  Skin:  Positive for rash.  Neurological: Negative.   Psychiatric/Behavioral: Negative.        Objective    BP 114/76 (BP Location: Right Arm, Patient Position: Sitting, Cuff Size: Normal)   Pulse (!) 101   Temp 98.1 F (36.7 C) (Oral)   Ht 5\' 10"  (1.778 m)   Wt 218 lb 8 oz (99.1 kg)   SpO2 98%   BMI 31.35 kg/m  {Show previous vital signs (optional):23777}   Physical Exam Constitutional:      Appearance: Normal appearance. He is obese.  HENT:     Head: Normocephalic and atraumatic.     Right Ear: External ear normal.     Left Ear: External ear normal.  Cardiovascular:     Rate and Rhythm: Normal rate and regular rhythm.     Pulses: Normal pulses.     Heart sounds: Normal heart sounds.  Pulmonary:     Effort: Pulmonary effort is normal.     Breath sounds: Normal breath sounds.  Abdominal:  Palpations: Abdomen is soft.  Skin:    Findings: Rash present.     Comments: Erythematous, papular rash diffusely spread across all 4 extremities & bilateral lower quadrants of abdomen; Flesh-colored vesicles present on all 10 fingers, especially concentrated at fingertips; skin dryness noted on dorsal surface of bilateral hands    Neurological:     Mental Status: He is alert.           No results found for any visits on 05/26/21.  Assessment & Plan     Problem List Items Addressed This Visit       Musculoskeletal and Integument   Dyshidrotic eczema    - Acute, uncomplicated dyshidrotic eczema on bilateral hands - Counseled pt. to  use unscented moisturizers or emollients prn for skin irritation      Other Visit Diagnoses     Drug rash    -  Primary   - Acute, uncomplicated drug rash likely 2/2 Lisinopril use - Stop Lisinopril - Start prednisone taper - Return to clinic if symptoms worsen or persist    Angioedema, initial encounter       - Acute, uncomplicated episode of lower lip swelling on Tues., has since resolved - Stop Lisinopril - Counseled pt. to present to ED if lower lip swelling recurs with tongue swelling or SOB - Will continue to monitor         Return if symptoms worsen or fail to improve.        Percell Locus, MS3   Patient seen along with MS3 student Percell Locus. I personally evaluated this patient along with the student, and verified all aspects of the history, physical exam, and medical decision making as documented by the student. I agree with the student's documentation and have made all necessary edits.  Houda Brau, Dionne Bucy, MD, MPH [facility]

## 2021-06-14 DIAGNOSIS — M545 Low back pain, unspecified: Secondary | ICD-10-CM | POA: Diagnosis not present

## 2021-06-14 DIAGNOSIS — E119 Type 2 diabetes mellitus without complications: Secondary | ICD-10-CM | POA: Diagnosis not present

## 2021-06-14 DIAGNOSIS — Z0000 Encounter for general adult medical examination without abnormal findings: Secondary | ICD-10-CM | POA: Diagnosis not present

## 2021-06-14 DIAGNOSIS — K2270 Barrett's esophagus without dysplasia: Secondary | ICD-10-CM | POA: Diagnosis not present

## 2021-06-14 DIAGNOSIS — E785 Hyperlipidemia, unspecified: Secondary | ICD-10-CM | POA: Diagnosis not present

## 2021-06-14 DIAGNOSIS — Z23 Encounter for immunization: Secondary | ICD-10-CM | POA: Diagnosis not present

## 2021-06-28 DIAGNOSIS — E119 Type 2 diabetes mellitus without complications: Secondary | ICD-10-CM | POA: Diagnosis not present

## 2021-06-28 DIAGNOSIS — M545 Low back pain, unspecified: Secondary | ICD-10-CM | POA: Diagnosis not present

## 2021-07-08 DIAGNOSIS — M4326 Fusion of spine, lumbar region: Secondary | ICD-10-CM | POA: Diagnosis not present

## 2021-07-08 DIAGNOSIS — M2578 Osteophyte, vertebrae: Secondary | ICD-10-CM | POA: Diagnosis not present

## 2021-07-08 DIAGNOSIS — M1611 Unilateral primary osteoarthritis, right hip: Secondary | ICD-10-CM | POA: Diagnosis not present

## 2021-07-08 DIAGNOSIS — M5441 Lumbago with sciatica, right side: Secondary | ICD-10-CM | POA: Diagnosis not present

## 2021-07-08 DIAGNOSIS — Z96642 Presence of left artificial hip joint: Secondary | ICD-10-CM | POA: Diagnosis not present

## 2021-07-08 DIAGNOSIS — M5431 Sciatica, right side: Secondary | ICD-10-CM | POA: Diagnosis not present

## 2021-07-08 DIAGNOSIS — M5440 Lumbago with sciatica, unspecified side: Secondary | ICD-10-CM | POA: Diagnosis not present

## 2021-07-08 DIAGNOSIS — M545 Low back pain, unspecified: Secondary | ICD-10-CM | POA: Diagnosis not present

## 2021-08-02 DIAGNOSIS — M1611 Unilateral primary osteoarthritis, right hip: Secondary | ICD-10-CM | POA: Diagnosis not present

## 2021-08-25 DIAGNOSIS — M1611 Unilateral primary osteoarthritis, right hip: Secondary | ICD-10-CM | POA: Diagnosis not present

## 2021-08-26 DIAGNOSIS — Z0000 Encounter for general adult medical examination without abnormal findings: Secondary | ICD-10-CM | POA: Diagnosis not present

## 2021-08-26 DIAGNOSIS — Z125 Encounter for screening for malignant neoplasm of prostate: Secondary | ICD-10-CM | POA: Diagnosis not present

## 2021-08-26 DIAGNOSIS — E119 Type 2 diabetes mellitus without complications: Secondary | ICD-10-CM | POA: Diagnosis not present

## 2021-08-26 DIAGNOSIS — E785 Hyperlipidemia, unspecified: Secondary | ICD-10-CM | POA: Diagnosis not present

## 2021-08-28 DIAGNOSIS — M1611 Unilateral primary osteoarthritis, right hip: Secondary | ICD-10-CM | POA: Insufficient documentation

## 2021-11-01 NOTE — Discharge Instructions (Signed)
Instructions after Total Hip Replacement     Kevin Mccullough P. Jessup Ogas, Jr., M.D.     Dept. of Orthopaedics & Sports Medicine  [facility]  [address]  [place], [name]  [address]  Phone: [phone]   Fax: [phone]    DIET: . Drink plenty of non-alcoholic fluids. . Resume your normal diet. Include foods high in fiber.  ACTIVITY:  . You may use crutches or a walker with weight-bearing as tolerated, unless instructed otherwise. . You may be weaned off of the walker or crutches by your Physical Therapist.  . Do NOT reach below the level of your knees or cross your legs until allowed.    . Continue doing gentle exercises. Exercising will reduce the pain and swelling, increase motion, and prevent muscle weakness.   . Please continue to use the TED compression stockings for 6 weeks. You may remove the stockings at night, but should reapply them in the morning. . Do not drive or operate any equipment until instructed.  WOUND CARE:  . Continue to use ice packs periodically to reduce pain and swelling. . Keep the incision clean and dry. . You may bathe or shower after the staples are removed at the first office visit following surgery.  MEDICATIONS: . You may resume your regular medications. . Please take the pain medication as prescribed on the medication. . Do not take pain medication on an empty stomach. . You have been given a prescription for a blood thinner to prevent blood clots. Please take the medication as instructed. (NOTE: After completing a 2 week course of Lovenox, take one Enteric-coated aspirin once a day.) . Pain medications and iron supplements can cause constipation. Use a stool softener (Senokot or Colace) on a daily basis and a laxative (dulcolax or miralax) as needed. . Do not drive or drink alcoholic beverages when taking pain medications.  CALL THE OFFICE FOR: . Temperature above 101 degrees . Excessive bleeding or drainage on the dressing. . Excessive  swelling, coldness, or paleness of the toes. . Persistent nausea and vomiting.  FOLLOW-UP:  . You should have an appointment to return to the office in 6 weeks after surgery. . Arrangements have been made for continuation of Physical Therapy (either home therapy or outpatient therapy).     [facility] Department Directory         [email or web address]       [email or web address]          Cardiology  Appointments: [place] - [phone] [place] - [phone]  Endocrinology  Appointments: [place] - [phone] [place] - [phone]  Gastroenterology  Appointments: [place] - [phone] [place] - [phone]        General Surgery   Appointments: [place] - [phone]  Internal Medicine/Family Medicine  Appointments: [place] - [phone] [place] - [phone] [place] - [phone]  Metabolic and Weigh Loss Surgery  Appointments: [place] - [phone]        Neurology  Appointments: [place] - [phone] [place] - [phone]  Neurosurgery  Appointments: [place] - [phone]  Obstetrics & Gynecology  Appointments: [place] - [phone] [place] - [phone]        Pediatrics  Appointments: [place] - [phone] [place] - [phone]  Physiatry  Appointments: [place] -[phone]  Physical Therapy  Appointments: [place] - [phone] [place] - [phone]        Podiatry  Appointments: [place] - [phone] [place] - [phone]  Pulmonology  Appointments:  - [phone]  Rheumatology  Appointments:  - [phone]         Location: [facility]  [address] [place], [name]  [address]  [place] Location: [facility] [address] [place], [name]  [address]  [place] Location: [facility] [address] [place], [name]  [address]    

## 2021-11-11 ENCOUNTER — Encounter
Admission: RE | Admit: 2021-11-11 | Discharge: 2021-11-11 | Disposition: A | Discharge status: Home / Self Care | Payer: Medicare HMO | Source: Ambulatory Visit | Attending: Orthopedic Surgery | Admitting: Orthopedic Surgery

## 2021-11-11 ENCOUNTER — Other Ambulatory Visit: Payer: Self-pay

## 2021-11-11 VITALS — BP 139/84 | HR 78 | Resp 18 | Ht 70.0 in

## 2021-11-11 DIAGNOSIS — E7800 Pure hypercholesterolemia, unspecified: Secondary | ICD-10-CM | POA: Insufficient documentation

## 2021-11-11 DIAGNOSIS — M1611 Unilateral primary osteoarthritis, right hip: Secondary | ICD-10-CM | POA: Diagnosis not present

## 2021-11-11 DIAGNOSIS — Z01812 Encounter for preprocedural laboratory examination: Secondary | ICD-10-CM

## 2021-11-11 DIAGNOSIS — E119 Type 2 diabetes mellitus without complications: Secondary | ICD-10-CM | POA: Diagnosis not present

## 2021-11-11 DIAGNOSIS — Z01810 Encounter for preprocedural cardiovascular examination: Secondary | ICD-10-CM | POA: Diagnosis not present

## 2021-11-11 DIAGNOSIS — Z01818 Encounter for other preprocedural examination: Secondary | ICD-10-CM | POA: Insufficient documentation

## 2021-11-11 HISTORY — DX: Pneumonia, unspecified organism: J18.9

## 2021-11-11 LAB — CBC
HCT: 41.7 % (ref 39.0–52.0)
Hemoglobin: 13.8 g/dL (ref 13.0–17.0)
MCH: 32.2 pg (ref 26.0–34.0)
MCHC: 33.1 g/dL (ref 30.0–36.0)
MCV: 97.2 fL (ref 80.0–100.0)
Platelets: 324 10*3/uL (ref 150–400)
RBC: 4.29 MIL/uL (ref 4.22–5.81)
RDW: 13.6 % (ref 11.5–15.5)
WBC: 7.9 10*3/uL (ref 4.0–10.5)
nRBC: 0 % (ref 0.0–0.2)

## 2021-11-11 LAB — SEDIMENTATION RATE: Sed Rate: 28 mm/hr — ABNORMAL HIGH (ref 0–20)

## 2021-11-11 LAB — TYPE AND SCREEN
ABO/RH(D): O POS
Antibody Screen: NEGATIVE

## 2021-11-11 LAB — COMPREHENSIVE METABOLIC PANEL
ALT: 31 U/L (ref 0–44)
AST: 33 U/L (ref 15–41)
Albumin: 4.2 g/dL (ref 3.5–5.0)
Alkaline Phosphatase: 59 U/L (ref 38–126)
Anion gap: 10 (ref 5–15)
BUN: 26 mg/dL — ABNORMAL HIGH (ref 8–23)
CO2: 23 mmol/L (ref 22–32)
Calcium: 9.7 mg/dL (ref 8.9–10.3)
Chloride: 108 mmol/L (ref 98–111)
Creatinine, Ser: 1.52 mg/dL — ABNORMAL HIGH (ref 0.61–1.24)
GFR, Estimated: 49 mL/min — ABNORMAL LOW (ref >60)
Glucose, Bld: 116 mg/dL — ABNORMAL HIGH (ref 70–99)
Potassium: 4.2 mmol/L (ref 3.5–5.1)
Sodium: 141 mmol/L (ref 135–145)
Total Bilirubin: 1 mg/dL (ref 0.3–1.2)
Total Protein: 7.9 g/dL (ref 6.5–8.1)

## 2021-11-11 LAB — URINALYSIS, ROUTINE W REFLEX MICROSCOPIC
Bacteria, UA: NONE SEEN
Bilirubin Urine: NEGATIVE
Glucose, UA: >=500 mg/dL — AB
Hgb urine dipstick: NEGATIVE
Ketones, ur: NEGATIVE mg/dL
Leukocytes,Ua: NEGATIVE
Nitrite: NEGATIVE
Protein, ur: NEGATIVE mg/dL
Specific Gravity, Urine: 1.001 — ABNORMAL LOW (ref 1.005–1.030)
Squamous Epithelial / HPF: NONE SEEN (ref 0–5)
WBC, UA: NONE SEEN WBC/hpf (ref 0–5)
pH: 5 (ref 5.0–8.0)

## 2021-11-11 LAB — C-REACTIVE PROTEIN: CRP: <0.5 mg/dL (ref <1.0)

## 2021-11-11 LAB — SURGICAL PCR SCREEN
MRSA, PCR: NEGATIVE
Staphylococcus aureus: NEGATIVE

## 2021-11-11 NOTE — Patient Instructions (Addendum)
Your procedure is scheduled on: 11/23/21 - Wednesday ?Report to the Registration Desk on the 1st floor of the [facility]. ?To find out your arrival time, please call [phone] between 1PM - 3PM on: 11/22/21 - Tuesday ? ?REMEMBER: ?Instructions that are not followed completely may result in serious medical risk, up to and including death; or upon the discretion of your surgeon and anesthesiologist your surgery may need to be rescheduled. ? ?Do not eat food after midnight the night before surgery.  ?No gum chewing, lozengers or hard candies. ? ?You may however, drink CLEAR liquids up to 2 hours before you are scheduled to arrive for your surgery. Do not drink anything within 2 hours of your scheduled arrival time ? ?Type 1 and Type 2 diabetics should only drink water. ? ?In addition, your doctor has ordered for you to drink the provided  ?Gatorade G2 ?Drinking this carbohydrate drink up to two hours before surgery helps to reduce insulin resistance and improve patient outcomes. Please complete drinking 2 hours prior to scheduled arrival time. ? ?TAKE THESE MEDICATIONS THE MORNING OF SURGERY WITH A SIP OF WATER: ? ?- omeprazole (PRILOSEC) 20 MG capsule, (take one the night before and one on the morning of surgery - helps to prevent nausea after surgery.) ? ?Empagliflozin-metFORMIN HCl (SYNJARDY) 12-998 MG - Stop taking beginning 03/26, may resume taking the day after surgery. ? ?Follow recommendations from Cardiologist, Pulmonologist or PCP regarding stopping Aspirin, Coumadin, Plavix, Eliquis, Pradaxa, or Pletal.  ? ?One week prior to surgery: ?Stop Anti-inflammatories (NSAIDS) such as Advil, Aleve, Ibuprofen, Motrin, Naproxen, Naprosyn and Aspirin based products such as Excedrin, Goodys Powder, BC Powder. ? ?Stop ANY OVER THE COUNTER supplements until after surgery. Stop starting 11/13/21. ? ?You may however, continue to take Tylenol if needed for pain up until the day of surgery. ? ?No Alcohol for 24 hours  before or after surgery. ? ?No Smoking including e-cigarettes for 24 hours prior to surgery.  ?No chewable tobacco products for at least 6 hours prior to surgery.  ?No nicotine patches on the day of surgery. ? ?Do not use any "recreational" drugs for at least a week prior to your surgery.  ?Please be advised that the combination of cocaine and anesthesia may have negative outcomes, up to and including death. ?If you test positive for cocaine, your surgery will be cancelled. ? ?On the morning of surgery brush your teeth with toothpaste and water, you may rinse your mouth with mouthwash if you wish. ?Do not swallow any toothpaste or mouthwash. ? ?Use CHG Soap or wipes as directed on instruction sheet. ? ?Do not wear jewelry, make-up, hairpins, clips or nail polish. ? ?Do not wear lotions, powders, or perfumes.  ? ?Do not shave body from the neck down 48 hours prior to surgery just in case you cut yourself which could leave a site for infection.  ?Also, freshly shaved skin may become irritated if using the CHG soap. ? ?Contact lenses, hearing aids and dentures may not be worn into surgery. ? ?Do not bring valuables to the hospital. [facility] is not responsible for any missing/lost belongings or valuables.  ? ?Notify your doctor if there is any change in your medical condition (cold, fever, infection). ? ?Wear comfortable clothing (specific to your surgery type) to the hospital. ? ?After surgery, you can help prevent lung complications by doing breathing exercises.  ?Take deep breaths and cough every 1-2 hours. Your doctor may order a device called an Chiropodist  to help you take deep breaths. ?When coughing or sneezing, hold a pillow firmly against your incision with both hands. This is called ?splinting.? Doing this helps protect your incision. It also decreases belly discomfort. ? ?If you are being admitted to the hospital overnight, leave your suitcase in the car. ?After surgery it may be brought to your  room. ? ?If you are being discharged the day of surgery, you will not be allowed to drive home. ?You will need a responsible adult (18 years or older) to drive you home and stay with you that night.  ? ?If you are taking public transportation, you will need to have a responsible adult (18 years or older) with you. ?Please confirm with your physician that it is acceptable to use public transportation.  ? ?Please call the [facility] Dept. at [phone] if you have any questions about these instructions. ? ?Surgery Visitation Policy: ? ?Patients undergoing a surgery or procedure may have one family member or support person with them as long as that person is not COVID-19 positive or experiencing its symptoms.  ?That person may remain in the waiting area during the procedure and may rotate out with other people. ? ?Inpatient Visitation:   ? ?Visiting hours are 7 a.m. to 8 p.m. ?Up to two visitors ages 16+ are allowed at one time in a patient room. The visitors may rotate out with other people during the day. Visitors must check out when they leave, or other visitors will not be allowed. One designated support person may remain overnight. ?The visitor must pass COVID-19 screenings, use hand sanitizer when entering and exiting the patient?s room and wear a mask at all times, including in the patient?s room. ?Patients must also wear a mask when staff or their visitor are in the room. ?Masking is required regardless of vaccination status.  ?

## 2021-11-14 LAB — HEMOGLOBIN A1C
Hgb A1c MFr Bld: 5.7 % — ABNORMAL HIGH (ref 4.8–5.6)
Mean Plasma Glucose: 117 mg/dL

## 2021-11-20 ENCOUNTER — Encounter: Payer: Self-pay | Admitting: Orthopedic Surgery

## 2021-11-20 NOTE — H&P (Signed)
ORTHOPAEDIC HISTORY & PHYSICAL ?Kevin Mccullough, [place] - 11/10/2021 9:00 AM EDT ?Formatting of this note is different from the original. ?[place] ?[facility]THOPAEDICS [facility]ICINE ?Chief Complaint:  ? ?Chief Complaint  ?Patient presents with  ? Knee Pain  ?H & P RIGHT HIP  ? ?History of Present Illness:  ? ?Kevin Mccullough is a 71 y.o. male that presents to clinic today for his preoperative history and evaluation. Patient presents unaccompanied. The patient is scheduled to undergo a right total hip arthroplasty on 11/23/21 by Dr. Marry Guan. His pain began around 6 months ago. The pain is located primarily in the right hip and groin. He describes his pain as worse with weightbearing. He reports associated loss of right hip range of motion. He denies associated numbness or tingling.  ? ?The patient's symptoms have progressed to the point that they decrease his quality of life. The patient has previously undergone conservative treatment including NSAIDS and activity modification without adequate control of his symptoms. ? ?Patient does have a history of lumbar fusion. Denies significant cardiac history, history of DVT.  ? ?Patient will be returning home to his wife following surgery.  ? ?Patient does have a history of type 2 diabetes. Last A1c on 08/26/2021 was 6.3. ? ?Past Medical, Surgical, Family, Social History, Allergies, Medications:  ? ?Past Medical History:  ?Past Medical History:  ?Diagnosis Date  ? Acute peptic ulcer with hemorrhage 2008  ? Barrett's esophagus without dysplasia 10/27/2018  ? Chicken pox  ? Type 2 diabetes mellitus (CMS-HCC) 03/2021  ? ?Past Surgical History:  ?Past Surgical History:  ?Procedure Laterality Date  ? UNLISTED LAPAROSCOPY PROCEDURE SPLEEN 1983  ? SPLENECTOMY 1983  ? Left hip bipolar hemiarthroplasty 2002  ? COLONOSCOPY 03/02/2005  ?Normal Colon: CBF 02/2015; Recall Ltr mailed 01/12/2015 (dw); OV made 04/27/2015 @ 11:15am w/Kim Jerelene Redden NP (dw)  ? EGD 04/13/2008  ?Gastric  Ulcer  ? EGD 06/30/2008  ?No repeat per RTE  ? COLONOSCOPY 10/28/2015  ?Hyperplastic Polyp: CBF 10/2025  ? EGD 10/28/2015  ?Barrett's Esophagus: CBF 10/2018: Recall ltr mailed  ? EGD 10/03/2019  ?Barrett's Esophagus/Repeat 41yr/TKT  ? Back Surgery 18250,[id number] 2011  ? CHOLECYSTECTOMY  ? SHOULDER SURGERY Left  ? TONSILLECTOMY  ? WISDOM TEETH  ? ?Current Medications:  ?Current Outpatient Medications  ?Medication Sig Dispense Refill  ? aspirin 81 MG EC tablet Take 81 mg by mouth once daily  ? atorvastatin (LIPITOR) 20 MG tablet Take 20 mg by mouth once daily  ? biotin 10,000 mcg Cap Take 1 capsule by mouth once daily  ? cholecalciferol (VITAMIN D3) 2,000 unit tablet Take 2,000 Units by mouth once daily  ? diphenhydrAMINE-acetaminophen (TYLENOL PM) 25-500 mg per tablet Take 2 tablets by mouth at bedtime  ? empagliflozin-metformin (SYNJARDY) 5-1,000 mg tablet Take 1 tablet by mouth once daily 90 tablet 1  ? multivitamin with iron-minerals (SUPER THERA VITE M) tablet Take 1 tablet by mouth once daily  ? omeprazole (PRILOSEC OTC) 20 MG EC tablet Take 1 tablet (20 mg total) by mouth once daily 30 tablet 11  ? ONETOUCH ULTRASOFT lancets Use 1 each 3 (three) times a week  ? ONETOUCH VERIO TEST STRIPS test strip 1 each (1 strip total) 3 (three) times a week 100 each 5  ? ?No current facility-administered medications for this visit.  ? ?Allergies:  ?Allergies  ?Allergen Reactions  ? Naproxen Other (See Comments)  ?STOMACH PAIN  ? Lisinopril Angioedema and Rash  ? ?Social History:  ?Social  History  ? ?Socioeconomic History  ? Marital status: Married  ?Spouse name: Kevin Mccullough  ?Occupational History  ?Comment: Retired  ?Tobacco Use  ? Smoking status: Former  ?Types: Cigarettes  ?Quit date: 08/29/1987  ?Years since quitting: 34.2  ? Smokeless tobacco: Never  ?Vaping Use  ? Vaping Use: Never used  ?Substance and Sexual Activity  ? Alcohol use: No  ?Alcohol/week: 0.0 standard drinks  ? Drug use: No  ? Sexual activity: Yes  ?Partners: Female   ? ?Family History:  ?Family History  ?Problem Relation Age of Onset  ? Liver disease Mother  ? Diabetes type II Mother  ? Asthma Father  ? Arthritis Sister  ? ?Review of Systems:  ? ?A 10+ ROS was performed, reviewed, and the pertinent orthopaedic findings are documented in the HPI.  ? ?Physical Examination:  ? ?BP 130/86 (BP Location: Left upper arm, Patient Position: Sitting, BP Cuff Size: Adult)  Ht 177.8 cm ('5\' 10"'$ )  Wt 95.7 kg (211 lb)  BMI 30.28 kg/m?  ? ?Patient is a well-developed, well-nourished male in no acute distress. Patient has normal mood and affect. Patient is alert and oriented to person, place, and time.  ? ?HEENT: Atraumatic, normocephalic. Pupils equal and reactive to light. Extraocular motion intact. Noninjected sclera. ? ?Cardiovascular: Regular rate and rhythm, with no murmurs, rubs, or gallops. Distal pulses palpable. No carotid bruits. ? ?Respiratory: Lungs clear to auscultation bilaterally.  ? ?  ?Right Hip: ?Pelvic tilt: Negative ?Limb lengths: Equal with the patient standing ?Soft tissue swelling: Negative ?Erythema: Negative ?Crepitance: Negative ?Tenderness: Greater trochanter is nontender to palpation. Moderate pain is elicited by axial compression or extremes of rotation. ?Atrophy: No atrophy. Good hip flexor and abductor strength. ?Range of Motion: EXT/FLEX: 0/10/90 ADD/ABD: 20/0/20 IR/ER: 0/0/15  ? ?Able to plantarflex and dorsiflex the right ankle. ? ?Sensation is intact over the saphenous, lateral cutaneous, superficial fibular, and deep fibular nerve distributions. ? ?Tests Performed/Reviewed:  ?X-rays ? ?No new radiographs were obtained today. Previous radiographs were reviewed of the right hip and revealed severe loss of femoral acetabular joint space with significant osteophyte formation. Left total hip arthroplasty also pictured. ? ?Impression:  ? ?ICD-10-CM  ?1. Primary osteoarthritis of right hip M16.11  ?2. DM type 2 with diabetic mixed hyperlipidemia (CMS-HCC) E11.69   ?E78.2  ? ?Plan:  ? ?The patient has end-stage degenerative changes of the right hip. It was explained to the patient that the condition is progressive in nature. Having failed conservative treatment, the patient has elected to proceed with a total joint arthroplasty. The patient will undergo a total joint arthroplasty with Dr. Marry Guan. The risks of surgery, including blood clot and infection, were discussed with the patient. Measures to reduce these risks, including the use of anticoagulation, perioperative antibiotics, and early ambulation were discussed. The importance of postoperative physical therapy was discussed with the patient. The patient elects to proceed with surgery. The patient is instructed to stop all blood thinners prior to surgery. The patient is instructed to call the hospital the day before surgery to learn of the proper arrival time.  ? ?Contact our office with any questions or concerns. Follow up as indicated, or sooner should any new problems arise, if conditions worsen, or if they are otherwise concerned.  ? ?Kevin Fudge, PA-C ?[facility] ?[address] ?[place], [place] [address] ?Phone: [phone] ? ?This note was generated in part with voice recognition software and I apologize for any typographical errors that were  not detected and corrected. ? ?Electronically signed by Kevin Fudge, PA at 11/10/2021 9:47 AM EDT ? ?

## 2021-11-21 ENCOUNTER — Other Ambulatory Visit: Payer: Medicare HMO

## 2021-11-22 MED ORDER — DEXAMETHASONE SODIUM PHOSPHATE 10 MG/ML IJ SOLN: 8.0000 mg | Freq: Once | INTRAMUSCULAR | Status: DC

## 2021-11-22 MED ORDER — CHLORHEXIDINE GLUCONATE 0.12 % MT SOLN: 15.0000 mL | Freq: Once | OROMUCOSAL | Status: AC

## 2021-11-22 MED ORDER — CELECOXIB 200 MG PO CAPS: 400.0000 mg | ORAL_CAPSULE | Freq: Once | ORAL | Status: AC

## 2021-11-22 MED ORDER — TRANEXAMIC ACID-NACL 1000-0.7 MG/100ML-% IV SOLN: 1000.0000 mg | INTRAVENOUS | Status: DC

## 2021-11-22 MED ORDER — GABAPENTIN 300 MG PO CAPS: 300.0000 mg | ORAL_CAPSULE | Freq: Once | ORAL | Status: DC

## 2021-11-22 MED ORDER — CHLORHEXIDINE GLUCONATE 4 % EX LIQD
60.0000 mL | Freq: Once | CUTANEOUS | Status: DC
  Administered 2021-11-23: 4 via TOPICAL

## 2021-11-22 MED ORDER — SODIUM CHLORIDE 0.9 % IV SOLN: INTRAVENOUS | Status: DC

## 2021-11-22 MED ORDER — ORAL CARE MOUTH RINSE
15.0000 mL | Freq: Once | OROMUCOSAL | Status: AC
Start: 2021-11-22 — End: 2021-11-23

## 2021-11-22 MED ORDER — CEFAZOLIN SODIUM-DEXTROSE 2-4 GM/100ML-% IV SOLN: 2.0000 g | INTRAVENOUS | Status: DC
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
## 2021-11-23 ENCOUNTER — Other Ambulatory Visit: Payer: Self-pay

## 2021-11-23 ENCOUNTER — Observation Stay: Payer: Medicare HMO

## 2021-11-23 ENCOUNTER — Ambulatory Visit: Payer: Medicare HMO

## 2021-11-23 ENCOUNTER — Encounter: Payer: Self-pay | Admitting: Orthopedic Surgery

## 2021-11-23 ENCOUNTER — Ambulatory Visit: Payer: Medicare HMO | Admitting: Urgent Care

## 2021-11-23 ENCOUNTER — Encounter
Admission: AD | Disposition: A | Discharge status: Home Health | Payer: Self-pay | Source: Home / Self Care | Attending: Orthopedic Surgery

## 2021-11-23 ENCOUNTER — Inpatient Hospital Stay
Admission: AD | Admit: 2021-11-23 | Discharge: 2021-11-25 | DRG: 470 | Disposition: A | Discharge status: Home Health | Payer: Medicare HMO | Attending: Orthopedic Surgery | Admitting: Orthopedic Surgery

## 2021-11-23 DIAGNOSIS — Z7984 Long term (current) use of oral hypoglycemic drugs: Secondary | ICD-10-CM

## 2021-11-23 DIAGNOSIS — Z888 Allergy status to other drugs, medicaments and biological substances status: Secondary | ICD-10-CM

## 2021-11-23 DIAGNOSIS — E7800 Pure hypercholesterolemia, unspecified: Secondary | ICD-10-CM

## 2021-11-23 DIAGNOSIS — Z87442 Personal history of urinary calculi: Secondary | ICD-10-CM

## 2021-11-23 DIAGNOSIS — M1611 Unilateral primary osteoarthritis, right hip: Principal | ICD-10-CM | POA: Diagnosis present

## 2021-11-23 DIAGNOSIS — Z87891 Personal history of nicotine dependence: Secondary | ICD-10-CM

## 2021-11-23 DIAGNOSIS — E782 Mixed hyperlipidemia: Secondary | ICD-10-CM | POA: Diagnosis present

## 2021-11-23 DIAGNOSIS — E1169 Type 2 diabetes mellitus with other specified complication: Secondary | ICD-10-CM | POA: Diagnosis present

## 2021-11-23 DIAGNOSIS — E119 Type 2 diabetes mellitus without complications: Secondary | ICD-10-CM

## 2021-11-23 DIAGNOSIS — Z833 Family history of diabetes mellitus: Secondary | ICD-10-CM

## 2021-11-23 DIAGNOSIS — Z7982 Long term (current) use of aspirin: Secondary | ICD-10-CM

## 2021-11-23 DIAGNOSIS — Z96641 Presence of right artificial hip joint: Secondary | ICD-10-CM

## 2021-11-23 DIAGNOSIS — Z9049 Acquired absence of other specified parts of digestive tract: Secondary | ICD-10-CM

## 2021-11-23 DIAGNOSIS — K2270 Barrett's esophagus without dysplasia: Secondary | ICD-10-CM | POA: Diagnosis present

## 2021-11-23 DIAGNOSIS — Z79899 Other long term (current) drug therapy: Secondary | ICD-10-CM

## 2021-11-23 DIAGNOSIS — Z9081 Acquired absence of spleen: Secondary | ICD-10-CM

## 2021-11-23 DIAGNOSIS — Z8719 Personal history of other diseases of the digestive system: Secondary | ICD-10-CM

## 2021-11-23 DIAGNOSIS — Z981 Arthrodesis status: Secondary | ICD-10-CM

## 2021-11-23 HISTORY — PX: TOTAL HIP ARTHROPLASTY: SHX124

## 2021-11-23 LAB — GLUCOSE, CAPILLARY
Glucose-Capillary: 94 mg/dL (ref 70–99)
Glucose-Capillary: 137 mg/dL — ABNORMAL HIGH (ref 70–99)
Glucose-Capillary: 155 mg/dL — ABNORMAL HIGH (ref 70–99)
Glucose-Capillary: 165 mg/dL — ABNORMAL HIGH (ref 70–99)

## 2021-11-23 SURGERY — ARTHROPLASTY, HIP, TOTAL,POSTERIOR APPROACH
Anesthesia: Spinal | Site: Hip | Laterality: Right

## 2021-11-23 MED ORDER — OXYCODONE HCL 5 MG PO TABS
5.0000 mg | ORAL_TABLET | ORAL | Status: DC | PRN
  Administered 2021-11-23: 5 mg via ORAL

## 2021-11-23 MED ORDER — EMPAGLIFLOZIN-METFORMIN HCL 5-1000 MG PO TABS: 1.0000 | ORAL_TABLET | Freq: Every day | ORAL | Status: DC

## 2021-11-23 MED ORDER — PANTOPRAZOLE SODIUM 40 MG PO TBEC
DELAYED_RELEASE_TABLET | ORAL | Status: AC
  Administered 2021-11-23: 40 mg via ORAL
  Filled 2021-11-23: qty 1

## 2021-11-23 MED ORDER — PRONTOSAN WOUND IRRIGATION OPTIME
TOPICAL | Status: DC | PRN
  Administered 2021-11-23: 1

## 2021-11-23 MED ORDER — CHLORHEXIDINE GLUCONATE 0.12 % MT SOLN
OROMUCOSAL | Status: AC
  Administered 2021-11-23: 15 mL via OROMUCOSAL
  Filled 2021-11-23: qty 15

## 2021-11-23 MED ORDER — FLEET ENEMA 7-19 GM/118ML RE ENEM: 1.0000 | ENEMA | Freq: Once | RECTAL | Status: DC | PRN

## 2021-11-23 MED ORDER — SENNOSIDES-DOCUSATE SODIUM 8.6-50 MG PO TABS
ORAL_TABLET | ORAL | Status: AC
  Administered 2021-11-23: 1 via ORAL
  Filled 2021-11-23: qty 1

## 2021-11-23 MED ORDER — ADULT MULTIVITAMIN W/MINERALS CH
1.0000 | ORAL_TABLET | Freq: Every day | ORAL | Status: DC
  Administered 2021-11-23 – 2021-11-25 (×3): 1 via ORAL
  Filled 2021-11-23 (×3): qty 1

## 2021-11-23 MED ORDER — FENTANYL CITRATE (PF) 100 MCG/2ML IJ SOLN
25.0000 ug | INTRAMUSCULAR | Status: DC | PRN
  Administered 2021-11-23 (×4): 50 ug via INTRAVENOUS

## 2021-11-23 MED ORDER — CEFAZOLIN SODIUM-DEXTROSE 2-4 GM/100ML-% IV SOLN
INTRAVENOUS | Status: AC
  Filled 2021-11-23: qty 100

## 2021-11-23 MED ORDER — PROPOFOL 10 MG/ML IV BOLUS
INTRAVENOUS | Status: DC | PRN
  Administered 2021-11-23: 150 mg via INTRAVENOUS
  Administered 2021-11-23: 50 mg via INTRAVENOUS

## 2021-11-23 MED ORDER — PHENYLEPHRINE HCL (PRESSORS) 10 MG/ML IV SOLN
INTRAVENOUS | Status: AC
  Filled 2021-11-23: qty 1

## 2021-11-23 MED ORDER — PROMETHAZINE HCL 25 MG/ML IJ SOLN: 6.2500 mg | INTRAMUSCULAR | Status: DC | PRN

## 2021-11-23 MED ORDER — CEFAZOLIN SODIUM-DEXTROSE 2-4 GM/100ML-% IV SOLN
2.0000 g | Freq: Four times a day (QID) | INTRAVENOUS | Status: AC
  Administered 2021-11-23: 2 g via INTRAVENOUS

## 2021-11-23 MED ORDER — LIDOCAINE HCL (CARDIAC) PF 100 MG/5ML IV SOSY
PREFILLED_SYRINGE | INTRAVENOUS | Status: DC | PRN
Start: 2021-11-23 — End: 2021-11-23
  Administered 2021-11-23: 100 mg via INTRAVENOUS

## 2021-11-23 MED ORDER — INSULIN ASPART 100 UNIT/ML IJ SOLN
INTRAMUSCULAR | Status: AC
  Administered 2021-11-23: 3 [IU] via SUBCUTANEOUS
  Filled 2021-11-23: qty 1

## 2021-11-23 MED ORDER — MAGNESIUM HYDROXIDE 400 MG/5ML PO SUSP
ORAL | Status: AC
  Administered 2021-11-23: 30 mL via ORAL
  Filled 2021-11-23: qty 30

## 2021-11-23 MED ORDER — METOCLOPRAMIDE HCL 10 MG PO TABS
ORAL_TABLET | ORAL | Status: AC
  Administered 2021-11-23: 10 mg via ORAL
  Filled 2021-11-23: qty 1

## 2021-11-23 MED ORDER — BISACODYL 10 MG RE SUPP
10.0000 mg | Freq: Every day | RECTAL | Status: DC | PRN
  Filled 2021-11-23: qty 1

## 2021-11-23 MED ORDER — TRANEXAMIC ACID-NACL 1000-0.7 MG/100ML-% IV SOLN
INTRAVENOUS | Status: DC | PRN
  Administered 2021-11-23: 1000 mg via INTRAVENOUS

## 2021-11-23 MED ORDER — VITAMIN D3 25 MCG (1000 UNIT) PO TABS
2000.0000 [IU] | ORAL_TABLET | Freq: Every day | ORAL | Status: DC
  Administered 2021-11-23 – 2021-11-25 (×3): 2000 [IU] via ORAL
  Filled 2021-11-23 (×3): qty 2

## 2021-11-23 MED ORDER — ONDANSETRON HCL 4 MG/2ML IJ SOLN
INTRAMUSCULAR | Status: AC
  Filled 2021-11-23: qty 2

## 2021-11-23 MED ORDER — DROPERIDOL 2.5 MG/ML IJ SOLN
0.6250 mg | Freq: Once | INTRAMUSCULAR | Status: DC | PRN
  Filled 2021-11-23: qty 2

## 2021-11-23 MED ORDER — FENTANYL CITRATE (PF) 100 MCG/2ML IJ SOLN
INTRAMUSCULAR | Status: AC
  Filled 2021-11-23: qty 2

## 2021-11-23 MED ORDER — DIPHENHYDRAMINE HCL 12.5 MG/5ML PO ELIX: 12.5000 mg | ORAL_SOLUTION | ORAL | Status: DC | PRN

## 2021-11-23 MED ORDER — MAGNESIUM HYDROXIDE 400 MG/5ML PO SUSP
30.0000 mL | Freq: Every day | ORAL | Status: DC
  Administered 2021-11-24 – 2021-11-25 (×2): 30 mL via ORAL

## 2021-11-23 MED ORDER — CELECOXIB 200 MG PO CAPS
ORAL_CAPSULE | ORAL | Status: AC
  Administered 2021-11-23: 400 mg via ORAL
  Filled 2021-11-23: qty 2

## 2021-11-23 MED ORDER — TRAMADOL HCL 50 MG PO TABS
ORAL_TABLET | ORAL | Status: AC
  Administered 2021-11-23: 50 mg via ORAL
  Filled 2021-11-23: qty 1

## 2021-11-23 MED ORDER — FERROUS SULFATE 325 (65 FE) MG PO TABS
325.0000 mg | ORAL_TABLET | Freq: Two times a day (BID) | ORAL | Status: DC
  Administered 2021-11-24 – 2021-11-25 (×2): 325 mg via ORAL

## 2021-11-23 MED ORDER — OXYCODONE HCL 5 MG PO TABS
5.0000 mg | ORAL_TABLET | Freq: Once | ORAL | Status: AC | PRN
  Administered 2021-11-23: 5 mg via ORAL

## 2021-11-23 MED ORDER — ACETAMINOPHEN 10 MG/ML IV SOLN
INTRAVENOUS | Status: AC
  Administered 2021-11-23: 1000 mg via INTRAVENOUS
  Filled 2021-11-23: qty 100

## 2021-11-23 MED ORDER — TRAMADOL HCL 50 MG PO TABS
ORAL_TABLET | ORAL | Status: AC
  Filled 2021-11-23: qty 2

## 2021-11-23 MED ORDER — GABAPENTIN 300 MG PO CAPS
ORAL_CAPSULE | ORAL | Status: AC
Start: 2021-11-23 — End: 2021-11-23
  Administered 2021-11-23: 300 mg via ORAL
  Filled 2021-11-23: qty 1

## 2021-11-23 MED ORDER — TRANEXAMIC ACID-NACL 1000-0.7 MG/100ML-% IV SOLN
INTRAVENOUS | Status: AC
  Filled 2021-11-23: qty 100

## 2021-11-23 MED ORDER — PROPOFOL 1000 MG/100ML IV EMUL
INTRAVENOUS | Status: AC
  Filled 2021-11-23: qty 100

## 2021-11-23 MED ORDER — ACETAMINOPHEN 325 MG PO TABS
325.0000 mg | ORAL_TABLET | Freq: Four times a day (QID) | ORAL | Status: DC | PRN
  Administered 2021-11-24: 650 mg via ORAL

## 2021-11-23 MED ORDER — MENTHOL 3 MG MT LOZG: 1.0000 | LOZENGE | OROMUCOSAL | Status: DC | PRN

## 2021-11-23 MED ORDER — ATORVASTATIN CALCIUM 20 MG PO TABS
20.0000 mg | ORAL_TABLET | Freq: Every evening | ORAL | Status: DC
  Administered 2021-11-23: 20 mg via ORAL
  Filled 2021-11-23 (×4): qty 1

## 2021-11-23 MED ORDER — PHENOL 1.4 % MT LIQD: 1.0000 | OROMUCOSAL | Status: DC | PRN

## 2021-11-23 MED ORDER — PHENYLEPHRINE 40 MCG/ML (10ML) SYRINGE FOR IV PUSH (FOR BLOOD PRESSURE SUPPORT)
PREFILLED_SYRINGE | INTRAVENOUS | Status: DC | PRN
  Administered 2021-11-23: 80 ug via INTRAVENOUS

## 2021-11-23 MED ORDER — ROCURONIUM BROMIDE 10 MG/ML (PF) SYRINGE
PREFILLED_SYRINGE | INTRAVENOUS | Status: AC
  Filled 2021-11-23: qty 10

## 2021-11-23 MED ORDER — OXYCODONE HCL 5 MG PO TABS
ORAL_TABLET | ORAL | Status: AC
  Filled 2021-11-23: qty 1

## 2021-11-23 MED ORDER — SODIUM CHLORIDE 0.9 % IR SOLN
Status: DC | PRN
  Administered 2021-11-23: 3000 mL

## 2021-11-23 MED ORDER — OXYCODONE HCL 5 MG/5ML PO SOLN: 5.0000 mg | Freq: Once | ORAL | Status: AC | PRN

## 2021-11-23 MED ORDER — ENOXAPARIN SODIUM 30 MG/0.3ML IJ SOSY
30.0000 mg | PREFILLED_SYRINGE | Freq: Two times a day (BID) | INTRAMUSCULAR | Status: DC
  Administered 2021-11-24 – 2021-11-25 (×3): 30 mg via SUBCUTANEOUS

## 2021-11-23 MED ORDER — FENTANYL CITRATE (PF) 250 MCG/5ML IJ SOLN
INTRAMUSCULAR | Status: AC
  Filled 2021-11-23: qty 5

## 2021-11-23 MED ORDER — PHENYLEPHRINE HCL-NACL 20-0.9 MG/250ML-% IV SOLN
INTRAVENOUS | Status: DC | PRN
  Administered 2021-11-23: 30 ug/min via INTRAVENOUS

## 2021-11-23 MED ORDER — OXYCODONE HCL 5 MG PO TABS
10.0000 mg | ORAL_TABLET | ORAL | Status: DC | PRN
  Administered 2021-11-24 – 2021-11-25 (×2): 10 mg via ORAL

## 2021-11-23 MED ORDER — HYDROMORPHONE HCL 1 MG/ML IJ SOLN
0.5000 mg | INTRAMUSCULAR | Status: DC | PRN
  Administered 2021-11-23 – 2021-11-25 (×2): 0.5 mg via INTRAVENOUS

## 2021-11-23 MED ORDER — HYDROMORPHONE HCL 1 MG/ML IJ SOLN
INTRAMUSCULAR | Status: AC
  Filled 2021-11-23: qty 0.5

## 2021-11-23 MED ORDER — NEOMYCIN-POLYMYXIN B GU 40-200000 IR SOLN
Status: AC
  Filled 2021-11-23: qty 8

## 2021-11-23 MED ORDER — ONDANSETRON HCL 4 MG/2ML IJ SOLN: 4.0000 mg | Freq: Four times a day (QID) | INTRAMUSCULAR | Status: DC | PRN

## 2021-11-23 MED ORDER — METOCLOPRAMIDE HCL 10 MG PO TABS
10.0000 mg | ORAL_TABLET | Freq: Three times a day (TID) | ORAL | Status: AC
  Administered 2021-11-24 – 2021-11-25 (×2): 10 mg via ORAL

## 2021-11-23 MED ORDER — FERROUS SULFATE 325 (65 FE) MG PO TABS
ORAL_TABLET | ORAL | Status: AC
  Administered 2021-11-23: 325 mg via ORAL
  Filled 2021-11-23: qty 1

## 2021-11-23 MED ORDER — PANTOPRAZOLE SODIUM 40 MG PO TBEC
40.0000 mg | DELAYED_RELEASE_TABLET | Freq: Two times a day (BID) | ORAL | Status: DC
  Administered 2021-11-24 – 2021-11-25 (×2): 40 mg via ORAL

## 2021-11-23 MED ORDER — DEXAMETHASONE SODIUM PHOSPHATE 10 MG/ML IJ SOLN
INTRAMUSCULAR | Status: AC
  Administered 2021-11-23: 8 mg via INTRAVENOUS
  Filled 2021-11-23: qty 1

## 2021-11-23 MED ORDER — INSULIN ASPART 100 UNIT/ML IJ SOLN: 0.0000 [IU] | Freq: Three times a day (TID) | INTRAMUSCULAR | Status: DC

## 2021-11-23 MED ORDER — MIDAZOLAM HCL 2 MG/2ML IJ SOLN
INTRAMUSCULAR | Status: AC
  Filled 2021-11-23: qty 2

## 2021-11-23 MED ORDER — CEFAZOLIN SODIUM-DEXTROSE 2-4 GM/100ML-% IV SOLN
INTRAVENOUS | Status: AC
Start: 2021-11-23 — End: 2021-11-23
  Filled 2021-11-23: qty 100

## 2021-11-23 MED ORDER — CELECOXIB 200 MG PO CAPS
200.0000 mg | ORAL_CAPSULE | Freq: Two times a day (BID) | ORAL | Status: DC
  Administered 2021-11-24: 200 mg via ORAL

## 2021-11-23 MED ORDER — BUPIVACAINE HCL (PF) 0.5 % IJ SOLN
INTRAMUSCULAR | Status: AC
  Filled 2021-11-23: qty 30

## 2021-11-23 MED ORDER — TRANEXAMIC ACID-NACL 1000-0.7 MG/100ML-% IV SOLN
INTRAVENOUS | Status: AC
  Administered 2021-11-23: 1000 mg via INTRAVENOUS
  Filled 2021-11-23: qty 100

## 2021-11-23 MED ORDER — ONDANSETRON HCL 4 MG PO TABS: 4.0000 mg | ORAL_TABLET | Freq: Four times a day (QID) | ORAL | Status: DC | PRN

## 2021-11-23 MED ORDER — SODIUM CHLORIDE 0.9 % IR SOLN: Status: DC | PRN

## 2021-11-23 MED ORDER — METFORMIN HCL 500 MG PO TABS: 1000.0000 mg | ORAL_TABLET | Freq: Every day | ORAL | Status: DC

## 2021-11-23 MED ORDER — ACETAMINOPHEN 10 MG/ML IV SOLN: 1000.0000 mg | Freq: Once | INTRAVENOUS | Status: DC | PRN

## 2021-11-23 MED ORDER — FENTANYL CITRATE (PF) 100 MCG/2ML IJ SOLN
INTRAMUSCULAR | Status: DC | PRN
  Administered 2021-11-23 (×2): 50 ug via INTRAVENOUS
  Administered 2021-11-23: 100 ug via INTRAVENOUS
  Administered 2021-11-23: 50 ug via INTRAVENOUS

## 2021-11-23 MED ORDER — CEFAZOLIN SODIUM-DEXTROSE 2-3 GM-%(50ML) IV SOLR
INTRAVENOUS | Status: DC | PRN
  Administered 2021-11-23: 2 g via INTRAVENOUS

## 2021-11-23 MED ORDER — INSULIN ASPART 100 UNIT/ML IJ SOLN: 0.0000 [IU] | Freq: Every day | INTRAMUSCULAR | Status: DC

## 2021-11-23 MED ORDER — ACETAMINOPHEN 10 MG/ML IV SOLN
INTRAVENOUS | Status: DC | PRN
  Administered 2021-11-23: 1000 mg via INTRAVENOUS

## 2021-11-23 MED ORDER — TRAMADOL HCL 50 MG PO TABS
50.0000 mg | ORAL_TABLET | ORAL | Status: DC | PRN
  Administered 2021-11-23: 100 mg via ORAL
  Administered 2021-11-24: 50 mg via ORAL

## 2021-11-23 MED ORDER — SENNOSIDES-DOCUSATE SODIUM 8.6-50 MG PO TABS
1.0000 | ORAL_TABLET | Freq: Two times a day (BID) | ORAL | Status: DC
  Administered 2021-11-24 – 2021-11-25 (×2): 1 via ORAL

## 2021-11-23 MED ORDER — TRANEXAMIC ACID-NACL 1000-0.7 MG/100ML-% IV SOLN: 1000.0000 mg | Freq: Once | INTRAVENOUS | Status: AC

## 2021-11-23 MED ORDER — ACETAMINOPHEN 10 MG/ML IV SOLN
1000.0000 mg | Freq: Four times a day (QID) | INTRAVENOUS | Status: AC
  Administered 2021-11-24 (×2): 1000 mg via INTRAVENOUS

## 2021-11-23 MED ORDER — ACETAMINOPHEN 10 MG/ML IV SOLN
INTRAVENOUS | Status: AC
  Filled 2021-11-23: qty 100

## 2021-11-23 MED ORDER — SUGAMMADEX SODIUM 200 MG/2ML IV SOLN
INTRAVENOUS | Status: DC | PRN
  Administered 2021-11-23: 200 mg via INTRAVENOUS

## 2021-11-23 MED ORDER — ROCURONIUM BROMIDE 100 MG/10ML IV SOLN
INTRAVENOUS | Status: DC | PRN
  Administered 2021-11-23: 50 mg via INTRAVENOUS
  Administered 2021-11-23: 60 mg via INTRAVENOUS
  Administered 2021-11-23: 40 mg via INTRAVENOUS

## 2021-11-23 MED ORDER — SODIUM CHLORIDE 0.9 % IV SOLN: INTRAVENOUS | Status: DC

## 2021-11-23 MED ORDER — EMPAGLIFLOZIN 10 MG PO TABS: 10.0000 mg | ORAL_TABLET | Freq: Every day | ORAL | Status: DC

## 2021-11-23 MED ORDER — ALUM & MAG HYDROXIDE-SIMETH 200-200-20 MG/5ML PO SUSP: 30.0000 mL | ORAL | Status: DC | PRN

## 2021-11-23 MED ORDER — CEFAZOLIN SODIUM-DEXTROSE 2-4 GM/100ML-% IV SOLN
INTRAVENOUS | Status: AC
  Administered 2021-11-23: 2 g via INTRAVENOUS
  Filled 2021-11-23: qty 100

## 2021-11-23 MED ORDER — ONDANSETRON HCL 4 MG/2ML IJ SOLN
INTRAMUSCULAR | Status: DC | PRN
  Administered 2021-11-23: 4 mg via INTRAVENOUS

## 2021-11-23 SURGICAL SUPPLY — 67 items
AML 16.5 LRG 12/14 OFFSET (Hips) ×2 IMPLANT
BLADE CLIPPER SURG (BLADE) ×1 IMPLANT
BLADE DRUM FLTD (BLADE) ×2 IMPLANT
BLADE SAW 90X25X1.19 OSCILLAT (BLADE) ×2 IMPLANT
CARTRIDGE OIL MAESTRO DRILL (MISCELLANEOUS) ×1 IMPLANT
DIFFUSER DRILL AIR PNEUMATIC (MISCELLANEOUS) ×2 IMPLANT
DRAPE 3/4 80X56 (DRAPES) ×2 IMPLANT
DRAPE INCISE IOBAN 66X60 STRL (DRAPES) ×2 IMPLANT
DRSG DERMACEA 8X12 NADH (GAUZE/BANDAGES/DRESSINGS) ×2 IMPLANT
DRSG MEPILEX SACRM 8.7X9.8 (GAUZE/BANDAGES/DRESSINGS) ×2 IMPLANT
DRSG OPSITE POSTOP 4X12 (GAUZE/BANDAGES/DRESSINGS) ×2 IMPLANT
DRSG OPSITE POSTOP 4X14 (GAUZE/BANDAGES/DRESSINGS) IMPLANT
DRSG TEGADERM 4X4.75 (GAUZE/BANDAGES/DRESSINGS) ×2 IMPLANT
DURAPREP 26ML APPLICATOR (WOUND CARE) ×3 IMPLANT
ELECT CAUTERY BLADE 6.4 (BLADE) ×2 IMPLANT
ELECT REM PT RETURN 9FT ADLT (ELECTROSURGICAL) ×2
ELECTRODE REM PT RTRN 9FT ADLT (ELECTROSURGICAL) ×1 IMPLANT
GAUZE 4X4 16PLY ~~LOC~~+RFID DBL (SPONGE) ×2 IMPLANT
GLOVE SURG ENC MOIS LTX SZ7.5 (GLOVE) ×4 IMPLANT
GLOVE SURG ENC TEXT LTX SZ7.5 (GLOVE) ×4 IMPLANT
GLOVE SURG UNDER LTX SZ8 (GLOVE) ×2 IMPLANT
GLOVE SURG UNDER POLY LF SZ7.5 (GLOVE) ×2 IMPLANT
GOWN STRL REUS W/ TWL LRG LVL3 (GOWN DISPOSABLE) ×2 IMPLANT
GOWN STRL REUS W/ TWL XL LVL3 (GOWN DISPOSABLE) ×1 IMPLANT
GOWN STRL REUS W/TWL LRG LVL3 (GOWN DISPOSABLE) ×4
GOWN STRL REUS W/TWL XL LVL3 (GOWN DISPOSABLE) ×2
HEAD M SROM 36MM 2 (Hips) IMPLANT
HEMOVAC 400CC 10FR (MISCELLANEOUS) ×2 IMPLANT
HIP AML 16.5 LRG 12/14 OFFSET (Hips) IMPLANT
HOLDER FOLEY CATH W/STRAP (MISCELLANEOUS) ×2 IMPLANT
HOLSTER ELECTROSUGICAL PENCIL (MISCELLANEOUS) ×3 IMPLANT
HOOD PEEL AWAY FLYTE STAYCOOL (MISCELLANEOUS) ×1 IMPLANT
IV NS IRRIG 3000ML ARTHROMATIC (IV SOLUTION) ×2 IMPLANT
KIT PEG BOARD PINK (KITS) ×2 IMPLANT
KIT TURNOVER KIT A (KITS) ×2 IMPLANT
LINER MARATHON 4MM 10D 36X56 (Hips) ×1 IMPLANT
MANIFOLD NEPTUNE II (INSTRUMENTS) ×4 IMPLANT
NDL SAFETY ECLIPSE 18X1.5 (NEEDLE) ×1 IMPLANT
NEEDLE HYPO 18GX1.5 SHARP (NEEDLE)
NS IRRIG 1000ML POUR BTL (IV SOLUTION) ×1 IMPLANT
NS IRRIG 500ML POUR BTL (IV SOLUTION) ×1 IMPLANT
OIL CARTRIDGE MAESTRO DRILL (MISCELLANEOUS) ×2
PACK HIP PROSTHESIS (MISCELLANEOUS) ×2 IMPLANT
PENCIL SMOKE EVACUATOR COATED (MISCELLANEOUS) ×1 IMPLANT
PIN SECT CUP 56MM (Hips) ×1 IMPLANT
PULSAVAC PLUS IRRIG FAN TIP (DISPOSABLE) ×2
SOL PREP PVP 2OZ (MISCELLANEOUS) ×2
SOLUTION PREP PVP 2OZ (MISCELLANEOUS) ×1 IMPLANT
SOLUTION PRONTOSAN WOUND 350ML (IRRIGATION / IRRIGATOR) ×2 IMPLANT
SPONGE DRAIN TRACH 4X4 STRL 2S (GAUZE/BANDAGES/DRESSINGS) ×2 IMPLANT
SPONGE T-LAP 18X18 ~~LOC~~+RFID (SPONGE) ×5 IMPLANT
SROM M HEAD 36MM 2 (Hips) ×2 IMPLANT
STAPLER SKIN PROX 35W (STAPLE) ×2 IMPLANT
SUT ETHIBOND #5 BRAIDED 30INL (SUTURE) ×2 IMPLANT
SUT VIC AB 0 CT1 36 (SUTURE) ×2 IMPLANT
SUT VIC AB 1 CT1 36 (SUTURE) ×4 IMPLANT
SUT VIC AB 2-0 CT1 27 (SUTURE) ×2
SUT VIC AB 2-0 CT1 TAPERPNT 27 (SUTURE) ×1 IMPLANT
SYR 20ML LL LF (SYRINGE) ×2 IMPLANT
TAPE CLOTH 3X10 WHT NS LF (GAUZE/BANDAGES/DRESSINGS) ×2 IMPLANT
TAPE TRANSPORE STRL 2 31045 (GAUZE/BANDAGES/DRESSINGS) ×2 IMPLANT
TIP FAN IRRIG PULSAVAC PLUS (DISPOSABLE) ×1 IMPLANT
TOWEL OR 17X26 4PK STRL BLUE (TOWEL DISPOSABLE) ×1 IMPLANT
TRAY FOLEY MTR SLVR 16FR STAT (SET/KITS/TRAYS/PACK) ×2 IMPLANT
TUBE KAMVAC SUCTION (TUBING) ×2 IMPLANT
WATER STERILE IRR 1000ML POUR (IV SOLUTION) ×1 IMPLANT
WATER STERILE IRR 500ML POUR (IV SOLUTION) ×1 IMPLANT

## 2021-11-23 NOTE — H&P (Signed)
The patient has been re-examined, and the chart reviewed, and there have been no interval changes to the documented history and physical.    The risks, benefits, and alternatives have been discussed at length. The patient expressed understanding of the risks benefits and agreed with plans for surgical intervention.  Marlette Curvin P. Kirtis Challis, Jr. M.D.    

## 2021-11-23 NOTE — Plan of Care (Signed)

## 2021-11-23 NOTE — Plan of Care (Signed)
?  Problem: Education: ?Goal: Knowledge of General Education information will improve ?Description: Including pain rating scale, medication(s)/side effects and non-pharmacologic comfort measures ?11/23/2021 1230 by Trystyn Sitts, Helane Gunther, RN ?Outcome: Progressing ?11/23/2021 1229 by Deetta Perla, RN ?Outcome: Progressing ?  ?Problem: Health Behavior/Discharge Planning: ?Goal: Ability to manage health-related needs will improve ?11/23/2021 1230 by Avika Carbine, Helane Gunther, RN ?Outcome: Progressing ?11/23/2021 1229 by Deetta Perla, RN ?Outcome: Progressing ?  ?Problem: Clinical Measurements: ?Goal: Ability to maintain clinical measurements within normal limits will improve ?11/23/2021 1230 by Loron Weimer, Helane Gunther, RN ?Outcome: Progressing ?11/23/2021 1229 by Deetta Perla, RN ?Outcome: Progressing ?Goal: Will remain free from infection ?11/23/2021 1230 by Celines Femia, Helane Gunther, RN ?Outcome: Progressing ?11/23/2021 1229 by Deetta Perla, RN ?Outcome: Progressing ?Goal: Diagnostic test results will improve ?11/23/2021 1230 by Jansen Sciuto, Helane Gunther, RN ?Outcome: Progressing ?11/23/2021 1229 by Deetta Perla, RN ?Outcome: Progressing ?Goal: Respiratory complications will improve ?11/23/2021 1230 by Brentlee Sciara, Helane Gunther, RN ?Outcome: Progressing ?11/23/2021 1229 by Deetta Perla, RN ?Outcome: Progressing ?Goal: Cardiovascular complication will be avoided ?11/23/2021 1230 by Vladimir Lenhoff, Helane Gunther, RN ?Outcome: Progressing ?11/23/2021 1229 by Deetta Perla, RN ?Outcome: Progressing ?  ?Problem: Activity: ?Goal: Risk for activity intolerance will decrease ?11/23/2021 1230 by Hillis Mcphatter, Helane Gunther, RN ?Outcome: Progressing ?11/23/2021 1229 by Deetta Perla, RN ?Outcome: Progressing ?  ?Problem: Nutrition: ?Goal: Adequate nutrition will be maintained ?11/23/2021 1230 by Hopelynn Gartland, Helane Gunther, RN ?Outcome: Progressing ?11/23/2021 1229 by Deetta Perla, RN ?Outcome: Progressing ?  ?Problem: Coping: ?Goal: Level of anxiety will decrease ?11/23/2021  1230 by Misaki Sozio, Helane Gunther, RN ?Outcome: Progressing ?11/23/2021 1229 by Deetta Perla, RN ?Outcome: Progressing ?  ?Problem: Elimination: ?Goal: Will not experience complications related to bowel motility ?11/23/2021 1230 by Corianna Avallone, Helane Gunther, RN ?Outcome: Progressing ?11/23/2021 1229 by Deetta Perla, RN ?Outcome: Progressing ?Goal: Will not experience complications related to urinary retention ?11/23/2021 1230 by Orlandis Sanden, Helane Gunther, RN ?Outcome: Progressing ?11/23/2021 1229 by Deetta Perla, RN ?Outcome: Progressing ?  ?Problem: Pain Managment: ?Goal: General experience of comfort will improve ?11/23/2021 1230 by Azariel Banik, Helane Gunther, RN ?Outcome: Progressing ?11/23/2021 1229 by Deetta Perla, RN ?Outcome: Progressing ?  ?Problem: Safety: ?Goal: Ability to remain free from injury will improve ?11/23/2021 1230 by Rakia Frayne, Helane Gunther, RN ?Outcome: Progressing ?11/23/2021 1229 by Deetta Perla, RN ?Outcome: Progressing ?  ?Problem: Skin Integrity: ?Goal: Risk for impaired skin integrity will decrease ?11/23/2021 1230 by Stefan Karen, Helane Gunther, RN ?Outcome: Progressing ?11/23/2021 1229 by Deetta Perla, RN ?Outcome: Progressing ?  ?Problem: Education: ?Goal: Knowledge of the prescribed therapeutic regimen will improve ?Outcome: Progressing ?Goal: Understanding of discharge needs will improve ?Outcome: Progressing ?Goal: Individualized Educational Video(s) ?Outcome: Progressing ?  ?Problem: Activity: ?Goal: Ability to avoid complications of mobility impairment will improve ?Outcome: Progressing ?Goal: Ability to tolerate increased activity will improve ?Outcome: Progressing ?  ?Problem: Clinical Measurements: ?Goal: Postoperative complications will be avoided or minimized ?Outcome: Progressing ?  ?Problem: Pain Management: ?Goal: Pain level will decrease with appropriate interventions ?Outcome: Progressing ?  ?Problem: Skin Integrity: ?Goal: Will show signs of wound healing ?Outcome: Progressing ?  ?

## 2021-11-23 NOTE — Evaluation (Signed)
Physical Therapy Evaluation ?Patient Details ?Name: Kevin Mccullough ?MRN: [id number] ?DOB: Mar 04, 1951 ?Today's Date: 11/23/2021 ? ?History of Present Illness ? Pt admitted for R THA and is POD 0 at time of evaluation.  ?Clinical Impression ? Pt is a pleasant 71 year old male who was admitted for R THA. Pt performs bed mobility with mod/max assist  and transfers with mod assist. Unable to ambulate at this time due to medical complications. Pt educated on hip precautions and WBing status prior to mobility efforts. Daughter present in room and throughout session. Pt demonstrates deficits with strength/mobility/pain. Once standing, pt became nauseated/dizzy, needing assist for seated at EOB with vitals taken. HR/O2 WNL on RA, however BP at 61/39 seated. Pt then had syncopal event while seated and required +2 for return supine and repositioned. Once back in bed, BP increased to 124/61. RN in room to assist. Currently not at baseline level. Anticipate quick improvement once medically stable and pain controlled. Pt very motivated and is hopeful for discharge home. At this time, currently not safe for home discharge, MD notified. Would benefit from skilled PT to address above deficits and promote optimal return to PLOF; recommend transition to STR upon discharge from acute hospitalization. ? ?   ? ?Recommendations for follow up therapy are one component of a multi-disciplinary discharge planning process, led by the attending physician.  Recommendations may be updated based on patient status, additional functional criteria and insurance authorization. ? ?Follow Up Recommendations Skilled nursing-short term rehab (<3 hours/day) ? ?  ?Assistance Recommended at Discharge Intermittent Supervision/Assistance  ?Patient can return home with the following ? Two people to help with walking and/or transfers;A lot of help with bathing/dressing/bathroom;Help with stairs or ramp for entrance ? ?  ?Equipment Recommendations None recommended  by PT  ?Recommendations for Other Services ?    ?  ?Functional Status Assessment Patient has had a recent decline in their functional status and demonstrates the ability to make significant improvements in function in a reasonable and predictable amount of time.  ? ?  ?Precautions / Restrictions Precautions ?Precautions: Fall;Posterior Hip ?Precaution Booklet Issued: No ?Restrictions ?Weight Bearing Restrictions: Yes ?RLE Weight Bearing: Weight bearing as tolerated  ? ?  ? ?Mobility ? Bed Mobility ?Overal bed mobility: Needs Assistance ?Bed Mobility: Supine to Sit ?  ?  ?Supine to sit: Mod assist, Max assist ?  ?  ?General bed mobility comments: needs quite a lot of assistance for bed mobility. Heavy cues for sequencing. Very fearful. Once seated at EOB, upright posture ?  ? ?Transfers ?Overall transfer level: Needs assistance ?Equipment used: Rolling walker (2 wheels) ?Transfers: Sit to/from Stand ?Sit to Stand: Mod assist ?  ?  ?  ?  ?  ?General transfer comment: needs assist for transfer as well as cues for sequencing. Once standing, begins to feel nauseated and begins to have diaphoresis. Assisted pt to sit back on bed and vitals taken. Notified RN to assist as pt then passed out while seated at EOB. +2 for return back to supine with bed in trend and another set of vitals performed ?  ? ?Ambulation/Gait ?  ?  ?  ?  ?  ?  ?  ?  ? ?Stairs ?  ?  ?  ?  ?  ? ?Wheelchair Mobility ?  ? ?Modified Rankin (Stroke Patients Only) ?  ? ?  ? ?Balance Overall balance assessment: Needs assistance ?Sitting-balance support: Feet supported ?Sitting balance-Leahy Scale: Good ?  ?  ?Standing balance support:  Bilateral upper extremity supported ?Standing balance-Leahy Scale: Fair ?  ?  ?  ?  ?  ?  ?  ?  ?  ?  ?  ?  ?   ? ? ? ?Pertinent Vitals/Pain Pain Assessment ?Pain Assessment: 0-10 ?Pain Score: 2  ?Pain Location: R hip ?Pain Descriptors / Indicators: Operative site guarding ?Pain Intervention(s): Limited activity within patient's  tolerance  ? ? ?Home Living Family/patient expects to be discharged to:: Private residence ?Living Arrangements: Spouse/significant other ?Available Help at Discharge: Family;Available 24 hours/day ?Type of Home: House ?Home Access: Stairs to enter ?Entrance Stairs-Rails: Can reach both ?Entrance Stairs-Number of Steps: 5 ?  ?Home Layout: One level ?Home Equipment: Conservation officer, nature (2 wheels);Cane - single point;Toilet riser ?   ?  ?Prior Function Prior Level of Function : Independent/Modified Independent ?  ?  ?  ?  ?  ?  ?Mobility Comments: very active, enjoys mowing his yard ?  ?  ? ? ?Hand Dominance  ?   ? ?  ?Extremity/Trunk Assessment  ? Upper Extremity Assessment ?Upper Extremity Assessment: Overall WFL for tasks assessed ?  ? ?Lower Extremity Assessment ?Lower Extremity Assessment: Generalized weakness (R LE grossly 3/5; L LE grossly 4/5) ?  ? ?   ?Communication  ? Communication: No difficulties  ?Cognition Arousal/Alertness: Awake/alert ?Behavior During Therapy: [facility] for tasks assessed/performed ?Overall Cognitive Status: Within Functional Limits for tasks assessed ?  ?  ?  ?  ?  ?  ?  ?  ?  ?  ?  ?  ?  ?  ?  ?  ?  ?  ?  ? ?  ?General Comments   ? ?  ?Exercises Other Exercises ?Other Exercises: supine ther-ex performed on R LE including AP, quad sets, glut sets, hip abd/add, and SAQ. 10 reps with min/mod assist  ? ?Assessment/Plan  ?  ?PT Assessment Patient needs continued PT services  ?PT Problem List Decreased strength;Decreased activity tolerance;Decreased balance;Decreased mobility;Decreased knowledge of use of DME;Decreased knowledge of precautions;Pain ? ?   ?  ?PT Treatment Interventions DME instruction;Gait training;Stair training;Therapeutic exercise;Balance training   ? ?PT Goals (Current goals can be found in the Care Plan section)  ?Acute Rehab PT Goals ?Patient Stated Goal: to go home tomorrow ?PT Goal Formulation: With patient ?Time For Goal Achievement: 12/07/21 ?Potential to Achieve Goals:  Good ? ?  ?Frequency BID ?  ? ? ?Co-evaluation   ?  ?  ?  ?  ? ? ?  ?AM-PAC PT "6 Clicks" Mobility  ?Outcome Measure Help needed turning from your back to your side while in a flat bed without using bedrails?: A Little ?Help needed moving from lying on your back to sitting on the side of a flat bed without using bedrails?: A Lot ?Help needed moving to and from a bed to a chair (including a wheelchair)?: A Lot ?Help needed standing up from a chair using your arms (e.g., wheelchair or bedside chair)?: A Lot ?Help needed to walk in hospital room?: A Lot ?Help needed climbing 3-5 steps with a railing? : Total ?6 Click Score: 12 ? ?  ?End of Session Equipment Utilized During Treatment: Gait belt ?Activity Tolerance: Treatment limited secondary to medical complications (Comment) ?Patient left: in bed;with family/visitor present;with nursing/sitter in room ?Nurse Communication: Mobility status ?PT Visit Diagnosis: Muscle weakness (generalized) (M62.81);Difficulty in walking, not elsewhere classified (R26.2);Dizziness and giddiness (R42);Pain ?Pain - Right/Left: Right ?Pain - part of body: Hip ?  ? ?Time: 4332-9518 ?PT  Time Calculation (min) (ACUTE ONLY): 38 min ? ? ?Charges:   PT Evaluation ?$PT Eval Moderate Complexity: 1 Mod ?PT Treatments ?$Therapeutic Exercise: 8-22 mins ?$Therapeutic Activity: 8-22 mins ?  ?   ? ? ?Greggory Stallion, PT, DPT, GCS ?[phone] ? ? ?Gyanna Jarema ?11/23/2021, 4:52 PM ? ?

## 2021-11-23 NOTE — Transfer of Care (Signed)
Immediate Anesthesia Transfer of Care Note ? ?Patient: Kevin Mccullough ? ?Procedure(s) Performed: TOTAL HIP ARTHROPLASTY (Right: Hip) ? ?Patient Location: PACU ? ?Anesthesia Type:General ? ?Level of Consciousness: awake and alert  ? ?Airway & Oxygen Therapy: Patient Spontanous Breathing and Patient connected to face mask oxygen ? ?Post-op Assessment: Report given to RN and Post -op Vital signs reviewed and stable ? ?Post vital signs: Reviewed and stable ? ?Last Vitals:  ?Vitals Value Taken Time  ?BP 141/77 11/23/21 1128  ?Temp    ?Pulse 79 11/23/21 1131  ?Resp 16 11/23/21 1131  ?SpO2 99 % 11/23/21 1131  ?Vitals shown include unvalidated device data. ? ?Last Pain:  ?Vitals:  ? 11/23/21 0635  ?TempSrc: Temporal  ?PainSc: 0-No pain  ?   ? ?  ? ?Complications: No notable events documented. ?

## 2021-11-23 NOTE — Anesthesia Procedure Notes (Signed)
Procedure Name: Intubation ?Date/Time: 11/23/2021 7:18 AM ?Performed by: Esaw Grandchild, CRNA ?Pre-anesthesia Checklist: Patient identified, Emergency Drugs available, Suction available and Patient being monitored ?Patient Re-evaluated:Patient Re-evaluated prior to induction ?Oxygen Delivery Method: Circle system utilized ?Preoxygenation: Pre-oxygenation with 100% oxygen ?Induction Type: IV induction ?Ventilation: Mask ventilation without difficulty ?Laryngoscope Size: Sabra Heck and 2 ?Grade View: Grade I ?Tube type: Oral ?Tube size: 7.5 mm ?Number of attempts: 1 ?Airway Equipment and Method: Stylet, Oral airway and Bite block ?Placement Confirmation: ETT inserted through vocal cords under direct vision, positive ETCO2 and breath sounds checked- equal and bilateral ?Secured at: 25 cm ?Tube secured with: Tape ?Dental Injury: Teeth and Oropharynx as per pre-operative assessment  ? ? ? ? ?

## 2021-11-23 NOTE — Op Note (Signed)
OPERATIVE NOTE ? ?DATE OF SURGERY:  11/23/2021 ? ?PATIENT NAME:  Kevin Mccullough   ?DOB: 04/12/1951  ?MRN: [id number] ? ?PRE-OPERATIVE DIAGNOSIS: Degenerative arthrosis of the right hip, primary ? ?POST-OPERATIVE DIAGNOSIS:  Same ? ?PROCEDURE:  Right total hip arthroplasty ? ?SURGEON:  Marciano Sequin. M.D. ? ?ASSISTANT: Cassell Smiles, PA-C (present and scrubbed throughout the case, critical for assistance with exposure, retraction, instrumentation, and closure) ? ?ANESTHESIA: general ? ?ESTIMATED BLOOD LOSS: 100 mL ? ?FLUIDS REPLACED: 1200 mL of crystalloid ? ?DRAINS: 2 medium Hemovac drains ? ?IMPLANTS UTILIZED: DePuy 16.5 mm large stature AML femoral stem, 56 mm OD Pinnacle 100 acetabular component, +4 mm 10 degree Pinnacle Marathon polyethylene insert, and a 36 mm M-SPEC -2 mm hip ball ? ?INDICATIONS FOR SURGERY: Kevin Mccullough is a 71 y.o. year old male with a long history of progressive hip and groin  pain. X-rays demonstrated severe degenerative changes. The patient had not seen any significant improvement despite conservative nonsurgical intervention. After discussion of the risks and benefits of surgical intervention, the patient expressed understanding of the risks benefits and agree with plans for total hip arthroplasty.  ? ?The risks, benefits, and alternatives were discussed at length including but not limited to the risks of infection, bleeding, nerve injury, stiffness, blood clots, the need for revision surgery, limb length inequality, dislocation, cardiopulmonary complications, among others, and they were willing to proceed. ? ?PROCEDURE IN DETAIL: The patient was brought into the operating room and, after adequate general anesthesia was achieved, the patient was placed in a left lateral decubitus position. Axillary roll was placed and all bony prominences were well-padded. The patient's right hip was cleaned and prepped with alcohol and DuraPrep and draped in the usual sterile fashion. A "timeout" was  performed as per usual protocol. A lateral curvilinear incision was made gently curving towards the posterior superior iliac spine. The IT band was incised in line with the skin incision and the fibers of the gluteus maximus were split in line. The piriformis tendon was identified, skeletonized, and incised at its insertion to the proximal femur and reflected posteriorly. A T type posterior capsulotomy was performed. Prior to dislocation of the femoral head, a threaded Steinmann pin was inserted through a separate stab incision into the pelvis superior to the acetabulum and bent in the form of a stylus so as to assess limb length and hip offset throughout the procedure. The femoral head was then dislocated posteriorly. Inspection of the femoral head demonstrated severe degenerative changes with full-thickness loss of articular cartilage. The femoral neck cut was performed using an oscillating saw. The anterior capsule was elevated off of the femoral neck using a periosteal elevator. Attention was then directed to the acetabulum. The remnant of the labrum was excised using electrocautery. Inspection of the acetabulum also demonstrated significant degenerative changes. The acetabulum was reamed in sequential fashion up to a 55 mm diameter. Good punctate bleeding bone was encountered. A 56 mm Pinnacle 100 acetabular component was positioned and impacted into place. Good scratch fit was appreciated. A neutral polyethylene trial was inserted. ? ?Attention was then directed to the proximal femur. A hole for reaming of the proximal femoral canal was created using a high-speed burr. The femoral canal was reamed in sequential fashion up to a 16 mm diameter. This allowed for approximately 6 cm of scratch fit.  It was thus elected to ream up to a 16.5 mm diameter to allow for a line to line fit.  Serial broaches  were inserted up to a 16.5 mm large stature femoral broach. Calcar region was planed and a trial reduction was  performed using a 36 mm hip ball with a +1.5 mm neck length.  Reasonably good stability was noted but it was elected to trial a +4 mm 10 degree polyethylene trial with the high side directed at the 8 o'clock position.  A 36 mm hip ball with a -2 mm neck length was placed.  Good equalization of limb lengths and hip offset was appreciated and excellent stability was noted both anteriorly and posteriorly. Trial components were removed. The acetabular shell was irrigated with copious amounts of normal saline with antibiotic solution and suctioned dry. A +4 mm 10 degree Pinnacle Marathon polyethylene insert was positioned and impacted into place. Next, a 16.5 mm large stature AML femoral stem was positioned with the high side at the 8 o'clock position and impacted into place. Excellent scratch fit was appreciated. A trial reduction was again performed with a 36 mm hip ball with a -2 mm neck length. Again, good equalization of limb lengths was appreciated and excellent stability appreciated both anteriorly and posteriorly. The hip was then dislocated and the trial hip ball was removed. The Morse taper was cleaned and dried. A 36 mm M-SPEC hip ball with a -2 mm neck length was placed on the trunnion and impacted into place. The hip was then reduced and placed through range of motion. Excellent stability was appreciated both anteriorly and posteriorly. ? ?The wound was irrigated with copious amounts of normal saline followed by 350 ml of Prontosan and suctioned dry. Good hemostasis was appreciated. The posterior capsulotomy was repaired using #5 Ethibond. Piriformis tendon was reapproximated to the undersurface of the gluteus medius tendon using #5 Ethibond. The IT band was reapproximated using interrupted sutures of #1 Vicryl. Subcutaneous tissue was approximated using first #0 Vicryl followed by #2-0 Vicryl. The skin was closed with skin staples. ? ?The patient tolerated the procedure well and was transported to the  recovery room in stable condition.  ? ?Marciano Sequin., M.D.  ?

## 2021-11-24 ENCOUNTER — Encounter: Payer: Self-pay | Admitting: Orthopedic Surgery

## 2021-11-24 LAB — GLUCOSE, CAPILLARY
Glucose-Capillary: 107 mg/dL — ABNORMAL HIGH (ref 70–99)
Glucose-Capillary: 128 mg/dL — ABNORMAL HIGH (ref 70–99)
Glucose-Capillary: 125 mg/dL — ABNORMAL HIGH (ref 70–99)
Glucose-Capillary: 95 mg/dL (ref 70–99)

## 2021-11-24 LAB — SURGICAL PATHOLOGY

## 2021-11-24 MED ORDER — METOCLOPRAMIDE HCL 10 MG PO TABS
ORAL_TABLET | ORAL | Status: AC
  Administered 2021-11-24: 10 mg via ORAL
  Filled 2021-11-24: qty 1

## 2021-11-24 MED ORDER — ENOXAPARIN SODIUM 30 MG/0.3ML IJ SOSY
PREFILLED_SYRINGE | INTRAMUSCULAR | Status: AC
Start: 2021-11-24 — End: 2021-11-24
  Filled 2021-11-24: qty 0.3

## 2021-11-24 MED ORDER — ACETAMINOPHEN 325 MG PO TABS
ORAL_TABLET | ORAL | Status: AC
  Filled 2021-11-24: qty 2

## 2021-11-24 MED ORDER — MAGNESIUM HYDROXIDE 400 MG/5ML PO SUSP
ORAL | Status: AC
  Filled 2021-11-24: qty 30

## 2021-11-24 MED ORDER — PANTOPRAZOLE SODIUM 40 MG PO TBEC
DELAYED_RELEASE_TABLET | ORAL | Status: AC
  Filled 2021-11-24: qty 1

## 2021-11-24 MED ORDER — SENNOSIDES-DOCUSATE SODIUM 8.6-50 MG PO TABS
ORAL_TABLET | ORAL | Status: AC
  Filled 2021-11-24: qty 1

## 2021-11-24 MED ORDER — OXYCODONE HCL 5 MG PO TABS
ORAL_TABLET | ORAL | Status: AC
  Filled 2021-11-24: qty 2

## 2021-11-24 MED ORDER — ACETAMINOPHEN 10 MG/ML IV SOLN
INTRAVENOUS | Status: AC
  Filled 2021-11-24: qty 100

## 2021-11-24 MED ORDER — OXYCODONE HCL 5 MG PO TABS
ORAL_TABLET | ORAL | Status: AC
  Administered 2021-11-24: 5 mg via ORAL
  Filled 2021-11-24: qty 1

## 2021-11-24 MED ORDER — SENNOSIDES-DOCUSATE SODIUM 8.6-50 MG PO TABS
ORAL_TABLET | ORAL | Status: AC
  Administered 2021-11-24: 1 via ORAL
  Filled 2021-11-24: qty 1

## 2021-11-24 MED ORDER — ACETAMINOPHEN 10 MG/ML IV SOLN
INTRAVENOUS | Status: AC
Start: 2021-11-24 — End: 2021-11-24
  Filled 2021-11-24: qty 100

## 2021-11-24 MED ORDER — PANTOPRAZOLE SODIUM 40 MG PO TBEC
DELAYED_RELEASE_TABLET | ORAL | Status: AC
  Administered 2021-11-24: 40 mg via ORAL
  Filled 2021-11-24: qty 1

## 2021-11-24 MED ORDER — SODIUM CHLORIDE 0.9 % IV BOLUS
500.0000 mL | Freq: Once | INTRAVENOUS | Status: AC
  Administered 2021-11-24: 500 mL via INTRAVENOUS

## 2021-11-24 MED ORDER — CELECOXIB 200 MG PO CAPS
ORAL_CAPSULE | ORAL | Status: AC
  Administered 2021-11-24: 200 mg via ORAL
  Filled 2021-11-24: qty 1

## 2021-11-24 MED ORDER — FERROUS SULFATE 325 (65 FE) MG PO TABS
ORAL_TABLET | ORAL | Status: AC
  Filled 2021-11-24: qty 1

## 2021-11-24 MED ORDER — TRAMADOL HCL 50 MG PO TABS
ORAL_TABLET | ORAL | Status: AC
  Filled 2021-11-24: qty 1

## 2021-11-24 MED ORDER — FERROUS SULFATE 325 (65 FE) MG PO TABS
ORAL_TABLET | ORAL | Status: AC
  Administered 2021-11-24: 325 mg via ORAL
  Filled 2021-11-24: qty 1

## 2021-11-24 MED ORDER — METOCLOPRAMIDE HCL 10 MG PO TABS
ORAL_TABLET | ORAL | Status: AC
  Filled 2021-11-24: qty 1

## 2021-11-24 MED ORDER — CELECOXIB 200 MG PO CAPS
ORAL_CAPSULE | ORAL | Status: AC
  Filled 2021-11-24: qty 1

## 2021-11-24 MED ORDER — ENOXAPARIN SODIUM 30 MG/0.3ML IJ SOSY
PREFILLED_SYRINGE | INTRAMUSCULAR | Status: AC
  Filled 2021-11-24: qty 0.3

## 2021-11-24 NOTE — Anesthesia Postprocedure Evaluation (Signed)
Anesthesia Post Note ? ?Patient: Kevin Mccullough ? ?Procedure(s) Performed: TOTAL HIP ARTHROPLASTY (Right: Hip) ? ?Patient location during evaluation: PACU ?Anesthesia Type: General ?Level of consciousness: awake and alert ?Pain management: pain level controlled ?Vital Signs Assessment: post-procedure vital signs reviewed and stable ?Respiratory status: spontaneous breathing, nonlabored ventilation and respiratory function stable ?Cardiovascular status: blood pressure returned to baseline and stable ?Postop Assessment: no apparent nausea or vomiting ?Anesthetic complications: no ? ? ?No notable events documented. ? ? ?Last Vitals:  ?Vitals:  ? 11/24/21 0400 11/24/21 0736  ?BP: 108/61 110/62  ?Pulse: 60 71  ?Resp: 18 16  ?Temp: (!) 36.2 ?C (!) 36.3 ?C  ?SpO2: 100% 99%  ?  ?Last Pain:  ?Vitals:  ? 11/24/21 0736  ?TempSrc: Temporal  ?PainSc:   ? ? ?  ?  ?  ?  ?  ?  ? ?[name] ? ? ? ? ?

## 2021-11-24 NOTE — Progress Notes (Cosign Needed)
Patient is not able to walk the distance required to go the bathroom, or he/she is unable to safely negotiate stairs required to access the bathroom.  A 3in1 BSC will alleviate this problem  

## 2021-11-24 NOTE — Discharge Summary (Addendum)
?Physician Discharge Summary  ?Patient ID: ?Kevin Mccullough ?MRN: [id number] ?DOB/AGE: [birth year] 72 y.o. ? ?Admit date: 11/23/2021 ?Discharge date: 11/25/2021 ? ?Admission Diagnoses:  ?Hx of total hip arthroplasty, right [Z96.641] ? ?Surgeries:Procedure(s): ?Right total hip arthroplasty ?  ?SURGEON:  Marciano Sequin. M.D. ?  ?ASSISTANT: Cassell Smiles, PA-C (present and scrubbed throughout the case, critical for assistance with exposure, retraction, instrumentation, and closure) ?  ?ANESTHESIA: general ?  ?ESTIMATED BLOOD LOSS: 100 mL ?  ?FLUIDS REPLACED: 1200 mL of crystalloid ?  ?DRAINS: 2 medium Hemovac drains ?  ?IMPLANTS UTILIZED: DePuy 16.5 mm large stature AML femoral stem, 56 mm OD Pinnacle 100 acetabular component, +4 mm 10 degree Pinnacle Marathon polyethylene insert, and a 36 mm M-SPEC -2 mm hip ball ? ?Discharge Diagnoses: ?Patient Active Problem List  ? Diagnosis Date Noted  ? Hx of total hip arthroplasty, right 11/23/2021  ? Primary osteoarthritis of right hip 08/28/2021  ? Dyshidrotic eczema 05/26/2021  ? Barrett's esophagus without dysplasia 10/27/2018  ? Choledocholithiasis with cholecystitis 10/06/2016  ? Calculus of bile duct without cholecystitis and without obstruction   ? Choledocholithiasis 09/21/2016  ? Gallstones 09/06/2016  ? AA (alopecia areata) 03/24/2015  ? Acid indigestion 03/24/2015  ? H/O peptic ulcer 03/24/2015  ? HLD (hyperlipidemia) 03/24/2015  ? Arthritis, degenerative 03/24/2015  ? Hypercholesterolemia without hypertriglyceridemia 03/24/2015  ? ? ?Past Medical History:  ?Diagnosis Date  ? Arthritis   ? Complication of anesthesia   ? PT WAS AWAKE DURING THE BEGINNING OF HIS SPLENECTOMY-WAS TOLD THAT THE SURGERY STARTED TO SOON AND ANESTHESIA HAD NOT FULLY KICKED IN   ? Diabetes mellitus without complication ([place])   ? Hepatitis   ? History of bleeding ulcers   ? History of kidney stones   ? Pneumonia   ? ?  ?Transfusion:  ?  ?Consultants (if any):  ? ?Discharged Condition:  Improved ? ?Hospital Course: Kevin Mccullough is an 71 y.o. male who was admitted 11/23/2021 with a diagnosis of right hip osteoarthritis and went to the operating room on 11/23/2021 and underwent right total hip arthroplasty through posterior approach. The patient received perioperative antibiotics for prophylaxis (see below). The patient tolerated the procedure well and was transported to PACU in stable condition. After meeting PACU criteria, the patient was subsequently transferred to the Orthopaedics/Rehabilitation unit.  ? ?The patient received DVT prophylaxis in the form of early mobilization, Lovenox, TED hose, and SCDs . A sacral pad had been placed and heels were elevated off of the bed with rolled towels in order to protect skin integrity. Foley catheter was discontinued on postoperative day #0. Wound drains were discontinued on postoperative day #2. The surgical incision was healing well without signs of infection. ? ?Physical therapy was initiated postoperatively for transfers, gait training, and strengthening. Occupational therapy was initiated for activities of daily living and evaluation for assisted devices. Rehabilitation goals were reviewed in detail with the patient. The patient made steady progress with physical therapy and physical therapy recommended discharge to Home.  ? ?The patient achieved the preliminary goals of this hospitalization and was felt to be medically and orthopaedically appropriate for discharge. ? ?He was given perioperative antibiotics:  ?Anti-infectives (From admission, onward)  ? ? Start     Dose/Rate Route Frequency Ordered Stop  ? 11/23/21 1330  ceFAZolin (ANCEF) IVPB 2g/100 mL premix       ? 2 g ?200 mL/hr over 30 Minutes Intravenous Every 6 hours 11/23/21 1253 11/23/21 1950  ? 11/23/21 0600  ceFAZolin (ANCEF) IVPB 2g/100 mL premix  Status:  Discontinued       ? 2 g ?200 mL/hr over 30 Minutes Intravenous On call to O.R. 11/22/21 2156 11/23/21 2000  ? ?  ?. ? ?Recent vital  signs:  ?Vitals:  ? 11/25/21 0623 11/25/21 0809  ?BP: 110/69 129/68  ?Pulse: 70 97  ?Resp: 16   ?Temp: 97.9 ?F (36.6 ?C) 97.8 ?F (36.6 ?C)  ?SpO2: 97% 98%  ? ? ?Recent laboratory studies:  ?No results for input(s): WBC, HGB, HCT, PLT, K, CL, CO2, BUN, CREATININE, GLUCOSE, CALCIUM, LABPT, INR in the last 72 hours. ? ?Diagnostic Studies: DG Hip Port Unilat With Pelvis 1V Right ? ?Result Date: 11/23/2021 ?CLINICAL DATA:  Status post right hip arthroplasty EXAM: DG HIP (WITH OR WITHOUT PELVIS) 1V PORT RIGHT COMPARISON:  None. FINDINGS: There is recent right hip arthroplasty. There are pockets of air in the soft tissues and skin staples. No fracture is seen. There is previous left hip arthroplasty. IMPRESSION: Status post right hip arthroplasty. Electronically Signed   By: Elmer Picker M.D.   On: 11/23/2021 12:07   ? ?Discharge Medications:   ?Allergies as of 11/25/2021   ? ?   Reactions  ? Naproxen Other (See Comments)  ? STOMACH PAIN  ? Lisinopril Rash  ? ?  ? ?  ?Medication List  ?  ? ?STOP taking these medications   ? ?aspirin EC 81 MG tablet ?  ? ?  ? ?TAKE these medications   ? ?atorvastatin 20 MG tablet ?Commonly known as: LIPITOR ?Take 1 tablet (20 mg total) by mouth daily. ?  ?Biotin 10000 MCG Tabs ?Take 10,000 mcg by mouth daily. ?  ?celecoxib 200 MG capsule ?Commonly known as: CELEBREX ?Take 1 capsule (200 mg total) by mouth 2 (two) times daily. ?  ?diphenhydramine-acetaminophen 25-500 MG Tabs tablet ?Commonly known as: TYLENOL PM ?Take 2 tablets by mouth at bedtime. ?  ?enoxaparin 40 MG/0.4ML injection ?Commonly known as: LOVENOX ?Inject 0.4 mLs (40 mg total) into the skin daily for 14 days. ?  ?glucose blood test strip ?Check blood sugar 3 times a week ?  ?multivitamin with minerals Tabs tablet ?Take 1 tablet by mouth daily. ?  ?omeprazole 20 MG capsule ?Commonly known as: PRILOSEC ?Take 20 mg by mouth daily. ?  ?onetouch ultrasoft lancets ?To check blood sugar 3 times a week ?  ?oxyCODONE 5 MG  immediate release tablet ?Commonly known as: Oxy IR/ROXICODONE ?Take 1 tablet (5 mg total) by mouth every 4 (four) hours as needed for moderate pain (pain score 4-6). ?  ?Synjardy 12-998 MG Tabs ?Generic drug: Empagliflozin-metFORMIN HCl ?Take 1 tablet by mouth daily. ?  ?traMADol 50 MG tablet ?Commonly known as: ULTRAM ?Take 1 tablet (50 mg total) by mouth every 4 (four) hours as needed for moderate pain. ?  ?Vitamin D3 50 MCG (2000 UT) Tabs ?Take 2,000 Units by mouth daily. ?  ? ?  ? ?  ?  ? ? ?  ?Durable Medical Equipment  ?(From admission, onward)  ?  ? ? ?  ? ?  Start     Ordered  ? 11/23/21 1124  DME Walker rolling  Once       ?Question:  Patient needs a walker to treat with the following condition  Answer:  S/P total hip arthroplasty  ? 11/23/21 1123  ? 11/23/21 1124  DME Bedside commode  Once       ?Question:  Patient needs a bedside commode to  treat with the following condition  Answer:  S/P total hip arthroplasty  ? 11/23/21 1123  ? ?  ?  ? ?  ? ? ?Disposition: Home with home health PT ? ? ? ? Follow-up Information   ? ? Dereck Leep, MD Follow up on 01/05/2022.   ?Specialty: Orthopedic Surgery ?Why: at 9:45am ?Contact information: ?[address] ?[place] [address] ?[phone] ? ? ?  ?  ? ?  ?  ? ?  ? ? ? ?Tamala Julian, PA-C ?11/25/2021, 9:48 AM ? ? ? ? ? ?

## 2021-11-24 NOTE — Progress Notes (Signed)
Patient was sitting EOB with RN tech assessing orthostatic BP. Pt was endorsing 4/10 pain. BP dropped to 88/52 in standing with pt profusely sweating.He did not endorse dizziness or nausea. Was able to take a few steps to sit in recliner prior to author elevating pt's feet. BP elevated to 116/57 in recliner. Ortho PA outside room and reports he will order IV bolus. Author will return later this morning to progress pt towards PT goals once bolus is complete.  ? ?Julaine Fusi PTA ?11/24/21, 8:13 AM  ?

## 2021-11-24 NOTE — Evaluation (Signed)
Occupational Therapy Evaluation ?Patient Details ?Name: Kevin Mccullough ?MRN: [id number] ?DOB: 1950/09/06 ?Today's Date: 11/24/2021 ? ? ?History of Present Illness Pt admitted for R THA and is POD 0 at time of evaluation.  ? ?Clinical Impression ?  ?Patient presenting with decreased Ind in self care, balance, functional mobility/transfers, endurance, and safety awareness. Patient reports being independent at baseline and very active. Patient currently very hypotensive and bolus of fluid is ordered but not running yet. Pt is agreeable to education. He is only able to verbalize 1 hip precautions and needing extensive education for self care tasks to maintain precautions with paper handout provided. Wife present for information as well but also seemed unsure. They would benefit from further education before returning home. Pt has all needed equipment as well as [place] and sock aide to utilize to increase Ind with LB self care. Patient will benefit from acute OT to increase overall independence in the areas of ADLs, functional mobility, and safety awareness in order to safely discharge home with family.  ?   ? ?Recommendations for follow up therapy are one component of a multi-disciplinary discharge planning process, led by the attending physician.  Recommendations may be updated based on patient status, additional functional criteria and insurance authorization.  ? ?Follow Up Recommendations ? Home health OT  ?  ?Assistance Recommended at Discharge Intermittent Supervision/Assistance  ?Patient can return home with the following A little help with walking and/or transfers;A little help with bathing/dressing/bathroom;Help with stairs or ramp for entrance;Assistance with cooking/housework ? ?  ?Functional Status Assessment ? Patient has had a recent decline in their functional status and demonstrates the ability to make significant improvements in function in a reasonable and predictable amount of time.  ?Equipment  Recommendations ? Other (comment) (has all needed equipment)  ?  ?   ?Precautions / Restrictions Precautions ?Precautions: Fall;Posterior Hip ?Precaution Booklet Issued: No ?Restrictions ?Weight Bearing Restrictions: Yes ?RLE Weight Bearing: Weight bearing as tolerated  ? ?  ? ?Mobility Bed Mobility ?  ?  ?  ?  ?  ?  ?  ?General bed mobility comments: pt was in recliner pre/post session ?  ? ? ?  ?  ?  ? ?  ?Balance Overall balance assessment: Needs assistance ?Sitting-balance support: Feet supported ?Sitting balance-Leahy Scale: Good ?  ?  ?  ?  ?  ?  ?  ?  ?  ?  ?  ?  ?  ?  ?  ?  ?   ? ?ADL either performed or assessed with clinical judgement  ? ?ADL Overall ADL's : Needs assistance/impaired ?  ?  ?  ?  ?  ?  ?  ?  ?  ?  ?  ?  ?  ?  ?  ?  ?  ?  ?  ?General ADL Comments: Min A for LB self care and functional transfers with use of RW.  ? ? ? ?Vision Patient Visual Report: No change from baseline ?   ?   ?   ?   ? ?Pertinent Vitals/Pain Pain Assessment ?Pain Assessment: 0-10 ?Pain Score: 4  ?Pain Location: R hip ?Pain Descriptors / Indicators: Grimacing, Guarding ?Pain Intervention(s): Limited activity within patient's tolerance, Monitored during session, Premedicated before session, Repositioned, Ice applied  ? ? ? ?Hand Dominance Right ?  ?Extremity/Trunk Assessment Upper Extremity Assessment ?Upper Extremity Assessment: Overall WFL for tasks assessed ?  ?Lower Extremity Assessment ?Lower Extremity Assessment: Generalized weakness ?  ?  ?  ?  Communication Communication ?Communication: No difficulties ?  ?Cognition Arousal/Alertness: Awake/alert ?Behavior During Therapy: [facility] for tasks assessed/performed ?Overall Cognitive Status: Within Functional Limits for tasks assessed ?  ?  ?  ?  ?  ?  ?  ?  ?  ?  ?  ?  ?  ?  ?  ?  ?General Comments: Pt is A and O x 4 ?  ?  ?   ?   ?   ? ? ?Home Living Family/patient expects to be discharged to:: Private residence ?Living Arrangements: Spouse/significant other ?Available Help at  Discharge: Family;Available 24 hours/day ?Type of Home: House ?Home Access: Stairs to enter ?Entrance Stairs-Number of Steps: 5 ?Entrance Stairs-Rails: Can reach both ?Home Layout: One level ?  ?  ?  ?  ?Bathroom Toilet: Handicapped height ?  ?  ?Home Equipment: Conservation officer, nature (2 wheels);Cane - single point;Toilet riser ?  ?  ?  ? ?  ?Prior Functioning/Environment Prior Level of Function : Independent/Modified Independent ?  ?  ?  ?  ?  ?  ?Mobility Comments: very active, enjoys mowing his yard ?ADLs Comments: Ind in all aspects of care and IADLs ?  ? ?  ?  ?OT Problem List: Decreased strength;Decreased range of motion;Decreased activity tolerance;Impaired balance (sitting and/or standing);Pain;Decreased safety awareness;Decreased knowledge of use of DME or AE;Decreased knowledge of precautions ?  ?   ?OT Treatment/Interventions: Self-care/ADL training;Therapeutic exercise;Therapeutic activities;Energy conservation;Visual/perceptual remediation/compensation;DME and/or AE instruction;Patient/family education;Manual therapy;Balance training  ?  ?OT Goals(Current goals can be found in the care plan section) Acute Rehab OT Goals ?Patient Stated Goal: to return home ?OT Goal Formulation: With patient/family ?Time For Goal Achievement: 12/09/21 ?Potential to Achieve Goals: Good ?ADL Goals ?Pt Will Perform Grooming: with modified independence;standing ?Pt Will Perform Lower Body Dressing: with modified independence;sit to/from stand;with adaptive equipment ?Pt Will Transfer to Toilet: with modified independence;ambulating ?Pt Will Perform Toileting - Clothing Manipulation and hygiene: with modified independence;sit to/from stand  ?OT Frequency: Min 2X/week ?  ? ?   ?AM-PAC OT "6 Clicks" Daily Activity     ?Outcome Measure Help from another person eating meals?: None ?Help from another person taking care of personal grooming?: A Little ?Help from another person toileting, which includes using toliet, bedpan, or urinal?: A  Little ?Help from another person bathing (including washing, rinsing, drying)?: A Little ?Help from another person to put on and taking off regular upper body clothing?: None ?Help from another person to put on and taking off regular lower body clothing?: A Little ?6 Click Score: 20 ?  ?End of Session Nurse Communication: Mobility status ? ?Activity Tolerance: Patient tolerated treatment well;Other (comment) (hypotension) ?Patient left: in chair;with family/visitor present;with call bell/phone within reach ? ?OT Visit Diagnosis: Unsteadiness on feet (R26.81);Muscle weakness (generalized) (M62.81)  ?              ?Time: 4315-4008 ?OT Time Calculation (min): 18 min ?Charges:  OT General Charges ?$OT Visit: 1 Visit ?OT Evaluation ?$OT Eval Moderate Complexity: 1 Mod ?OT Treatments ?$Self Care/Home Management : 8-22 mins ? ?Darleen Crocker, [place], OTR/L , CBIS ?ascom [phone]  ?11/24/21, 12:33 PM  ?

## 2021-11-24 NOTE — Progress Notes (Signed)
Physical Therapy Treatment ?Patient Details ?Name: Kevin Mccullough ?MRN: [id number] ?DOB: [birth year] ?Today's Date: 11/24/2021 ? ? ?History of Present Illness Pt admitted for R THA and is POD 0 at time of evaluation. ? ?  ?PT Comments  ? ? Pt was sitting in recliner upon arriving. His IV bolus is complete and pt is agreeable to session. BP in 120s/60s in sitting,in standing, and then after performing stairs. Pt was able to progress his mobility this session with much improved safety. DC recs updated to home with HHPT. Pt was able to ambulate ~ 120 ft and did perform stair training however profusely sweating and c/o slight dizziness. MD/PA made aware. Will return this afternoon for another PT session. Will issue HEP and continue to progress pt to PLOF.  ?  ?Recommendations for follow up therapy are one component of a multi-disciplinary discharge planning process, led by the attending physician.  Recommendations may be updated based on patient status, additional functional criteria and insurance authorization. ? ?Follow Up Recommendations ? Home health PT ?  ?  ?Assistance Recommended at Discharge Intermittent Supervision/Assistance  ?Patient can return home with the following A little help with walking and/or transfers;A little help with bathing/dressing/bathroom;Assistance with cooking/housework;Assist for transportation;Help with stairs or ramp for entrance ?  ?Equipment Recommendations ? None recommended by PT  ?  ?   ?Precautions / Restrictions Precautions ?Precautions: Fall;Posterior Hip ?Precaution Booklet Issued: No ?Restrictions ?Weight Bearing Restrictions: Yes ?RLE Weight Bearing: Weight bearing as tolerated  ?  ? ?Mobility ? Bed Mobility ?   ?General bed mobility comments: pt was in recliner pre/post session ?  ? ?Transfers ?Overall transfer level: Needs assistance ?Equipment used: Rolling walker (2 wheels) ?Transfers: Sit to/from Stand ?Sit to Stand: Min guard ?   ?General transfer comment: CGA for safety  only. Performed STS 4 x throughout session. ?  ? ?Ambulation/Gait ?Ambulation/Gait assistance: Supervision ?Gait Distance (Feet): 120 Feet ?Assistive device: Rolling walker (2 wheels) ?Gait Pattern/deviations: Antalgic, Step-to pattern, Decreased stance time - right, Decreased step length - left ?Gait velocity: decreased ?  ?  ?General Gait Details: Pt was able to ambulate without LOB or safety concern however after performing stairs, pt does have dizziness (BP 122/66) with profuse sweating ? ?  ?Balance Overall balance assessment: Needs assistance ?Sitting-balance support: Feet supported ?Sitting balance-Leahy Scale: Good ?  ?  ?Standing balance support: Bilateral upper extremity supported, Reliant on assistive device for balance ?Standing balance-Leahy Scale: Good ?  ?   ?Cognition Arousal/Alertness: Awake/alert ?Behavior During Therapy: [facility] for tasks assessed/performed ?Overall Cognitive Status: Within Functional Limits for tasks assessed ?  ?   ?General Comments: Pt is A and O x 4 ?  ?  ? ?  ?   ?   ? ?Pertinent Vitals/Pain Pain Assessment ?Pain Assessment: 0-10 ?Pain Score: 4  ?Pain Location: R hip ?Pain Descriptors / Indicators: Operative site guarding ?Pain Intervention(s): Limited activity within patient's tolerance, Monitored during session, Premedicated before session, Repositioned, Ice applied  ? ? ? ?PT Goals (current goals can now be found in the care plan section) Acute Rehab PT Goals ?Patient Stated Goal: to go home ?Progress towards PT goals: Progressing toward goals ? ?  ?Frequency ? ? ? BID ? ? ? ?  ?PT Plan Discharge plan needs to be updated  ? ? ?   ?AM-PAC PT "6 Clicks" Mobility   ?Outcome Measure ? Help needed turning from your back to your side while in a flat bed without using bedrails?: A  Little ?Help needed moving from lying on your back to sitting on the side of a flat bed without using bedrails?: A Little ?Help needed moving to and from a bed to a chair (including a wheelchair)?: A  Little ?Help needed standing up from a chair using your arms (e.g., wheelchair or bedside chair)?: A Little ?Help needed to walk in hospital room?: A Little ?Help needed climbing 3-5 steps with a railing? : A Little ?6 Click Score: 18 ? ?  ?End of Session Equipment Utilized During Treatment: Gait belt ?Activity Tolerance: Patient limited by pain ?Patient left: in chair;with chair alarm set;with nursing/sitter in room ?Nurse Communication: Mobility status ?PT Visit Diagnosis: Muscle weakness (generalized) (M62.81);Difficulty in walking, not elsewhere classified (R26.2);Dizziness and giddiness (R42);Pain ?Pain - Right/Left: Right ?Pain - part of body: Hip ?  ? ? ?Time: 5638-7564 ?PT Time Calculation (min) (ACUTE ONLY): 30 min ? ?Charges:  $Gait Training: 8-22 mins ?$Therapeutic Activity: 8-22 mins          ?          ?Julaine Fusi PTA ?11/24/21, 12:20 PM  ? ?

## 2021-11-24 NOTE — Progress Notes (Signed)
Physical Therapy Treatment ?Patient Details ?Name: Kevin Mccullough ?MRN: [id number] ?DOB: 1951/06/20 ?Today's Date: 11/24/2021 ? ? ?History of Present Illness Pt admitted for R THA and is POD 0 at time of evaluation. ? ?  ?PT Comments  ? ? Pt was sitting in recliner upon arriving. " I'm feeling pretty good overall." He is A and O x 4 and has supportive daughter at bedside. Pt's BP was stable throughout session without symptoms of dizziness or orthostatic hypotension. He was able to stand and ambulate with RW + supervision only. Returned to room and pt performed there ex handout. Overall pt is progressing. Will re-address stair training in AM session. Anticipate pt will be able to return home after AM session tomorrow if cleared medically. HHPT at DC most appropriate.  ?  ?Recommendations for follow up therapy are one component of a multi-disciplinary discharge planning process, led by the attending physician.  Recommendations may be updated based on patient status, additional functional criteria and insurance authorization. ? ?Follow Up Recommendations ? Home health PT ?  ?  ?Assistance Recommended at Discharge Intermittent Supervision/Assistance  ?Patient can return home with the following A little help with walking and/or transfers;A little help with bathing/dressing/bathroom;Assistance with cooking/housework;Assist for transportation;Help with stairs or ramp for entrance ?  ?Equipment Recommendations ? None recommended by PT  ?  ?   ?Precautions / Restrictions Precautions ?Precautions: Fall;Posterior Hip ?Precaution Booklet Issued: Yes (comment) ?Restrictions ?Weight Bearing Restrictions: Yes ?RLE Weight Bearing: Weight bearing as tolerated  ?  ? ?Mobility ? Bed Mobility ? General bed mobility comments: in recliner pre/post session ?  ? ?Transfers ?Overall transfer level: Needs assistance ?Equipment used: Rolling walker (2 wheels) ?Transfers: Sit to/from Stand ?Sit to Stand: Supervision ?   ?General transfer  comment: CGA for safety only. Performed STS 4 x throughout session. ?  ? ?Ambulation/Gait ?Ambulation/Gait assistance: Supervision ?Gait Distance (Feet): 150 Feet ?Assistive device: Rolling walker (2 wheels) ?Gait Pattern/deviations: Antalgic, Step-to pattern, Decreased stance time - right, Decreased step length - left ?Gait velocity: decreased ?  ?  ?General Gait Details: Pt was able to ambulate without LOB or safety concern. No difficulty or safety concern. ? ?  ?Balance Overall balance assessment: Needs assistance ?Sitting-balance support: Feet supported ?Sitting balance-Leahy Scale: Good ?  ?  ?Standing balance support: Bilateral upper extremity supported, Reliant on assistive device for balance ?Standing balance-Leahy Scale: Good ?  ?  ?  ?Cognition Arousal/Alertness: Awake/alert ?Behavior During Therapy: [facility] for tasks assessed/performed ?Overall Cognitive Status: Within Functional Limits for tasks assessed ?  ?   ?General Comments: Pt is A and O x 4 ?  ?  ? ?  ?Exercises Total Joint Exercises ?Ankle Circles/Pumps: AROM, 10 reps ?Quad Sets: AROM, 10 reps ?Gluteal Sets: AROM, 10 reps ?Heel Slides: AROM, 10 reps ?Hip ABduction/ADduction: 10 reps, AAROM ?Straight Leg Raises: AAROM, 10 reps ? ?  ?   ? ?Pertinent Vitals/Pain Pain Assessment ?Pain Assessment: 0-10 ?Pain Score: 4  ?Pain Location: R hip ?Pain Descriptors / Indicators: Grimacing, Guarding ?Pain Intervention(s): Limited activity within patient's tolerance, Monitored during session, Premedicated before session, Repositioned, Ice applied  ? ? ?Home Living Family/patient expects to be discharged to:: Private residence ?Living Arrangements: Spouse/significant other ?Available Help at Discharge: Family;Available 24 hours/day ?Type of Home: House ?Home Access: Stairs to enter ?Entrance Stairs-Rails: Can reach both ?Entrance Stairs-Number of Steps: 5 ?  ?Home Layout: One level ?Home Equipment: Conservation officer, nature (2 wheels);Cane - single point;Toilet riser ?   ?  ?    ? ?  PT Goals (current goals can now be found in the care plan section) Acute Rehab PT Goals ?Patient Stated Goal: to go home ?Progress towards PT goals: Progressing toward goals ? ?  ?Frequency ? ? ? BID ? ? ? ?  ?PT Plan Current plan remains appropriate  ? ? ?   ?AM-PAC PT "6 Clicks" Mobility   ?Outcome Measure ? Help needed turning from your back to your side while in a flat bed without using bedrails?: A Little ?Help needed moving from lying on your back to sitting on the side of a flat bed without using bedrails?: A Little ?Help needed moving to and from a bed to a chair (including a wheelchair)?: A Little ?Help needed standing up from a chair using your arms (e.g., wheelchair or bedside chair)?: A Little ?Help needed to walk in hospital room?: A Little ?Help needed climbing 3-5 steps with a railing? : A Little ?6 Click Score: 18 ? ?  ?End of Session Equipment Utilized During Treatment: Gait belt ?Activity Tolerance: Patient tolerated treatment well ?Patient left: in chair;with chair alarm set;with nursing/sitter in room ?Nurse Communication: Mobility status ?PT Visit Diagnosis: Muscle weakness (generalized) (M62.81);Difficulty in walking, not elsewhere classified (R26.2);Dizziness and giddiness (R42);Pain ?Pain - Right/Left: Right ?Pain - part of body: Hip ?  ? ? ?Time: 0086-7619 ?PT Time Calculation (min) (ACUTE ONLY): 29 min ? ?Charges:  $Gait Training: 8-22 mins ?$Therapeutic Exercise: 8-22 mins          ?          ? ?Julaine Fusi PTA ?11/24/21, 4:05 PM  ? ?

## 2021-11-24 NOTE — Progress Notes (Signed)
Met with the patient in the room at the bedside ?They live at home with their spouse ?They currently have rolling walker and raised toilet,  ?They need a 3in 1 ?They have transportation with Family ?They can afford their medication ?They are set up with [facility] for Home health services , he is refusing to go to [facility] ?

## 2021-11-24 NOTE — Progress Notes (Signed)
?  Subjective: ?1 Day Post-Op Procedure(s) (LRB): ?TOTAL HIP ARTHROPLASTY (Right) ?Patient reports pain as  significant when he stands . Spoke with PT prior to seeing patient, patient experienced another orthostatic episode with attempted ambulation this AM.   ?Plan is to go Home after hospital stay. ?Negative for chest pain and shortness of breath ?Fever: no ?Gastrointestinal: negative for nausea and vomiting.  Patient has not had a bowel movement. ? ?Objective: ?Vital signs in last 24 hours: ?Temp:  [97.2 ?F (36.2 ?C)-98.4 ?F (36.9 ?C)] 97.3 ?F (36.3 ?C) (03/30 9390) ?Pulse Rate:  [60-92] 89 (03/30 0800) ?Resp:  [11-18] 16 (03/30 0736) ?BP: (61-143)/(39-82) 116/57 (03/30 0800) ?SpO2:  [95 %-100 %] 99 % (03/30 0736) ? ?Intake/Output from previous day: ? ?Intake/Output Summary (Last 24 hours) at 11/24/2021 0825 ?Last data filed at 11/24/2021 0800 ?Gross per 24 hour  ?Intake 3046.16 ml  ?Output 1370 ml  ?Net 1676.16 ml  ?  ?Intake/Output this shift: ?Total I/O ?In: -  ?Out: 300 [Urine:300] ? ?Labs: ?No results for input(s): HGB in the last 72 hours. ?No results for input(s): WBC, RBC, HCT, PLT in the last 72 hours. ?No results for input(s): NA, K, CL, CO2, BUN, CREATININE, GLUCOSE, CALCIUM in the last 72 hours. ?No results for input(s): LABPT, INR in the last 72 hours. ? ? ?EXAM ?General - Patient is Alert, Appropriate, and Oriented ?Extremity - Neurovascular intact ?Dorsiflexion/Plantar flexion intact ?Compartment soft ?Dressing/Incision -clean, dry, no drainage, Hemovac in place.  ?Motor Function - intact, moving foot and toes well on exam.    ?Cardiovascular- Regular rate and rhythm, no murmurs/rubs/gallops ?Respiratory- Lungs clear to auscultation bilaterally ?Gastrointestinal- soft, nontender, and active bowel sounds ? ? ?Assessment/Plan: ?1 Day Post-Op Procedure(s) (LRB): ?TOTAL HIP ARTHROPLASTY (Right) ?Principal Problem: ?  Hx of total hip arthroplasty, right ? ?Estimated body mass index is 29.41 kg/m? as  calculated from the following: ?  Height as of this encounter: '5\' 10"'$  (1.778 m). ?  Weight as of this encounter: 93 kg. ?Advance diet ?Up with therapy ? ? ?500 cc bolus NaCl ordered. Possible d/c later this PM pending adequate progress with PT ? ?  ? ?DVT Prophylaxis - Lovenox, Ted hose, and SCDs ?Weight-Bearing as tolerated to right leg ? ?Cassell Smiles, PA-C ?[facility]opa[facility] ?11/24/2021, 8:25 AM ? ?

## 2021-11-25 DIAGNOSIS — M1611 Unilateral primary osteoarthritis, right hip: Secondary | ICD-10-CM | POA: Diagnosis present

## 2021-11-25 DIAGNOSIS — Z9049 Acquired absence of other specified parts of digestive tract: Secondary | ICD-10-CM | POA: Diagnosis not present

## 2021-11-25 DIAGNOSIS — Z7982 Long term (current) use of aspirin: Secondary | ICD-10-CM | POA: Diagnosis not present

## 2021-11-25 DIAGNOSIS — E782 Mixed hyperlipidemia: Secondary | ICD-10-CM | POA: Diagnosis present

## 2021-11-25 DIAGNOSIS — Z87891 Personal history of nicotine dependence: Secondary | ICD-10-CM | POA: Diagnosis not present

## 2021-11-25 DIAGNOSIS — Z888 Allergy status to other drugs, medicaments and biological substances status: Secondary | ICD-10-CM | POA: Diagnosis not present

## 2021-11-25 DIAGNOSIS — Z87442 Personal history of urinary calculi: Secondary | ICD-10-CM | POA: Diagnosis not present

## 2021-11-25 DIAGNOSIS — E1169 Type 2 diabetes mellitus with other specified complication: Secondary | ICD-10-CM | POA: Diagnosis present

## 2021-11-25 DIAGNOSIS — Z981 Arthrodesis status: Secondary | ICD-10-CM | POA: Diagnosis not present

## 2021-11-25 DIAGNOSIS — Z833 Family history of diabetes mellitus: Secondary | ICD-10-CM | POA: Diagnosis not present

## 2021-11-25 DIAGNOSIS — K2270 Barrett's esophagus without dysplasia: Secondary | ICD-10-CM | POA: Diagnosis present

## 2021-11-25 DIAGNOSIS — Z8719 Personal history of other diseases of the digestive system: Secondary | ICD-10-CM | POA: Diagnosis not present

## 2021-11-25 DIAGNOSIS — Z9081 Acquired absence of spleen: Secondary | ICD-10-CM | POA: Diagnosis not present

## 2021-11-25 DIAGNOSIS — Z7984 Long term (current) use of oral hypoglycemic drugs: Secondary | ICD-10-CM | POA: Diagnosis not present

## 2021-11-25 DIAGNOSIS — Z79899 Other long term (current) drug therapy: Secondary | ICD-10-CM | POA: Diagnosis not present

## 2021-11-25 LAB — GLUCOSE, CAPILLARY
Glucose-Capillary: 103 mg/dL — ABNORMAL HIGH (ref 70–99)
Glucose-Capillary: 116 mg/dL — ABNORMAL HIGH (ref 70–99)

## 2021-11-25 MED ORDER — HYDROMORPHONE HCL 1 MG/ML IJ SOLN
INTRAMUSCULAR | Status: AC
  Filled 2021-11-25: qty 0.5

## 2021-11-25 MED ORDER — OXYCODONE HCL 5 MG PO TABS
ORAL_TABLET | ORAL | Status: AC
Start: 2021-11-25 — End: 2021-11-25
  Filled 2021-11-25: qty 2

## 2021-11-25 MED ORDER — ENOXAPARIN SODIUM 30 MG/0.3ML IJ SOSY
PREFILLED_SYRINGE | INTRAMUSCULAR | Status: AC
  Filled 2021-11-25: qty 0.3

## 2021-11-25 MED ORDER — CELECOXIB 200 MG PO CAPS: 200.0000 mg | ORAL_CAPSULE | Freq: Two times a day (BID) | ORAL | 0 refills | Status: DC

## 2021-11-25 MED ORDER — OXYCODONE HCL 5 MG PO TABS: 5.0000 mg | ORAL_TABLET | ORAL | 0 refills | Status: DC | PRN

## 2021-11-25 MED ORDER — FERROUS SULFATE 325 (65 FE) MG PO TABS
ORAL_TABLET | ORAL | Status: AC
  Filled 2021-11-25: qty 1

## 2021-11-25 MED ORDER — ENOXAPARIN SODIUM 40 MG/0.4ML IJ SOSY: 40.0000 mg | PREFILLED_SYRINGE | INTRAMUSCULAR | 0 refills | Status: DC

## 2021-11-25 MED ORDER — PANTOPRAZOLE SODIUM 40 MG PO TBEC
DELAYED_RELEASE_TABLET | ORAL | Status: AC
  Filled 2021-11-25: qty 1

## 2021-11-25 MED ORDER — TRAMADOL HCL 50 MG PO TABS: 50.0000 mg | ORAL_TABLET | ORAL | 0 refills | Status: DC | PRN

## 2021-11-25 MED ORDER — METOCLOPRAMIDE HCL 10 MG PO TABS
ORAL_TABLET | ORAL | Status: AC
Start: 2021-11-25 — End: 2021-11-25
  Administered 2021-11-25: 10 mg
  Filled 2021-11-25: qty 1

## 2021-11-25 MED ORDER — METOCLOPRAMIDE HCL 10 MG PO TABS
ORAL_TABLET | ORAL | Status: AC
  Filled 2021-11-25: qty 1

## 2021-11-25 MED ORDER — CELECOXIB 200 MG PO CAPS
ORAL_CAPSULE | ORAL | Status: AC
  Filled 2021-11-25: qty 1

## 2021-11-25 MED ORDER — SENNOSIDES-DOCUSATE SODIUM 8.6-50 MG PO TABS
ORAL_TABLET | ORAL | Status: AC
  Filled 2021-11-25: qty 1

## 2021-11-25 MED ORDER — MAGNESIUM HYDROXIDE 400 MG/5ML PO SUSP
ORAL | Status: AC
  Filled 2021-11-25: qty 30

## 2021-11-25 NOTE — TOC Progression Note (Signed)
Transition of Care (TOC) - Progression Note  ? ? ?Patient Details  ?Name: Kevin Mccullough ?MRN: [id number] ?Date of Birth: 1950/09/29 ? ?Transition of Care (TOC) CM/SW Contact  ?Anselm Pancoast, RN ?Phone Number: ?11/25/2021, 12:51 PM ? ?Clinical Narrative:    ?Outreach to [employer] with DME request for [facility] and RW. Confirmed order is in system.  ? ? ?Expected Discharge Plan: [place] ?Barriers to Discharge: Continued Medical Work up ? ?Expected Discharge Plan and Services ?Expected Discharge Plan: [place] ?  ?Discharge Planning Services: CM Consult ?  ?Living arrangements for the past 2 months: [place] ?Expected Discharge Date: 11/25/21               ?DME Arranged: 3-N-1 ?DME Agency: [employer] ?Date DME Agency Contacted: 11/24/21 ?Time DME Agency Contacted: [phone] ?Representative spoke with at DME Agency: Suanne Marker ?HH Arranged: PT ?[place] Agency: [place] ?Date HH Agency Contacted: 11/24/21 ?Time [place]: [phone] ?Representative spoke with at [place]: [place] ? ? ?Social Determinants of Health (SDOH) Interventions ?  ? ?Readmission Risk Interventions ?   ? View : No data to display.  ?  ?  ?  ? ? ?

## 2021-11-25 NOTE — Progress Notes (Signed)
?  Subjective: ?2 Days Post-Op Procedure(s) (LRB): ?TOTAL HIP ARTHROPLASTY (Right) ?Patient reports pain as moderate.   ?Patient is well, and has had no acute complaints or problems ?Plan is to go Home after hospital stay. ?Negative for chest pain and shortness of breath ?Fever: no ?Gastrointestinal: negative for nausea and vomiting.  Patient has not had a bowel movement. ? ?Objective: ?Vital signs in last 24 hours: ?Temp:  [97.8 ?F (36.6 ?C)-100.2 ?F (37.9 ?C)] 97.8 ?F (36.6 ?C) (03/31 0809) ?Pulse Rate:  [68-97] 97 (03/31 0809) ?Resp:  [16-17] 16 (03/31 9417) ?BP: (110-129)/(65-69) 129/68 (03/31 0809) ?SpO2:  [97 %-98 %] 98 % (03/31 0809) ? ?Intake/Output from previous day: ? ?Intake/Output Summary (Last 24 hours) at 11/25/2021 0828 ?Last data filed at 11/25/2021 4081 ?Gross per 24 hour  ?Intake --  ?Output 3075 ml  ?Net -3075 ml  ?  ?Intake/Output this shift: ?Total I/O ?In: -  ?Out: 225 [Urine:225] ? ?Labs: ?No results for input(s): HGB in the last 72 hours. ?No results for input(s): WBC, RBC, HCT, PLT in the last 72 hours. ?No results for input(s): NA, K, CL, CO2, BUN, CREATININE, GLUCOSE, CALCIUM in the last 72 hours. ?No results for input(s): LABPT, INR in the last 72 hours. ? ? ?EXAM ?General - Patient is Alert, Appropriate, and Oriented ?Extremity - Neurovascular intact ?Dorsiflexion/Plantar flexion intact ?Compartment soft ?Dressing/Incision -clean, dry, no drainage, Hemovac in place.  ?Motor Function - intact, moving foot and toes well on exam.    ?Cardiovascular- Regular rate and rhythm, no murmurs/rubs/gallops ?Respiratory- Lungs clear to auscultation bilaterally ?Gastrointestinal- soft, nontender, and active bowel sounds ? ? ?Assessment/Plan: ?2 Days Post-Op Procedure(s) (LRB): ?TOTAL HIP ARTHROPLASTY (Right) ?Principal Problem: ?  Hx of total hip arthroplasty, right ? ?Estimated body mass index is 29.41 kg/m? as calculated from the following: ?  Height as of this encounter: '5\' 10"'$  (1.778 m). ?  Weight as  of this encounter: 93 kg. ?Advance diet ?Up with therapy ?Discharge home with home health pending completion of PT goals this AM and patient having BM. Spoke with RN about giving suppository if no BM by lunch. RN also instructed to pull hemovac drain after patient completes PT.  ? ? ?  ? ?DVT Prophylaxis - Lovenox, Ted hose, and SCDs ?Weight-Bearing as tolerated to right leg ? ?Cassell Smiles, PA-C ?[facility] Orthopaedic Surgery ?11/25/2021, 8:28 AM ? ?

## 2021-11-25 NOTE — Progress Notes (Signed)
Occupational Therapy Treatment ?Patient Details ?Name: Kevin Mccullough ?MRN: [id number] ?DOB: 07-Jul-1951 ?Today's Date: 11/25/2021 ? ? ?History of present illness Pt admitted for R THA and is POD 0 at time of evaluation. ?  ?OT comments ? Upon entering the room, pt seated in recliner chair and is agreeable to OT intervention. Pt is able to recall all posterior hip precautions this session. Pt stands with supervision and ambulates to bathroom for toilet transfer with supervision overall. Pt needing min cuing for safety awareness throughout. Pt stands at sink for hand hygiene with supervision and ambulates back to room in same manner as above. Ice applied to R hip for pain management as requested by pt. Pt reports feeling much better about return to home and no further questions at this time. Call bell and all needed items within reach.  ? ?Recommendations for follow up therapy are one component of a multi-disciplinary discharge planning process, led by the attending physician.  Recommendations may be updated based on patient status, additional functional criteria and insurance authorization. ?   ?Follow Up Recommendations ? Home health OT  ?  ?Assistance Recommended at Discharge Intermittent Supervision/Assistance  ?Patient can return home with the following ? A little help with walking and/or transfers;A little help with bathing/dressing/bathroom;Help with stairs or ramp for entrance;Assistance with cooking/housework ?  ?Equipment Recommendations ? Other (comment) (has all needed equipment)  ?  ?Recommendations for Other Services   ? ?  ?Precautions / Restrictions Precautions ?Precautions: Fall;Posterior Hip ?Precaution Booklet Issued: Yes (comment) ?Restrictions ?Weight Bearing Restrictions: Yes ?RLE Weight Bearing: Weight bearing as tolerated  ? ? ?  ? ?Mobility Bed Mobility ?  ?  ?  ?  ?  ?  ?  ?General bed mobility comments: in recliner pre/post session ?  ? ?Transfers ?Overall transfer level: Needs  assistance ?Equipment used: Rolling walker (2 wheels) ?Transfers: Sit to/from Stand ?Sit to Stand: Supervision ?  ?  ?  ?  ?  ?General transfer comment: min cuing for technique and hand placement but no physical assist ?  ?  ?Balance Overall balance assessment: Needs assistance ?Sitting-balance support: Feet supported ?Sitting balance-Leahy Scale: Good ?  ?  ?Standing balance support: Bilateral upper extremity supported, Reliant on assistive device for balance ?Standing balance-Leahy Scale: Good ?  ?  ?  ?  ?  ?  ?  ?  ?  ?  ?  ?  ?   ? ?ADL either performed or assessed with clinical judgement  ? ?ADL Overall ADL's : Needs assistance/impaired ?  ?  ?  ?  ?  ?  ?  ?  ?  ?  ?  ?  ?Toilet Transfer: Supervision/safety;Rolling walker (2 wheels);Comfort height toilet ?  ?Toileting- Clothing Manipulation and Hygiene: Supervision/safety;Sit to/from stand ?  ?  ?  ?Functional mobility during ADLs: Supervision/safety;Rolling walker (2 wheels) ?  ?  ? ?Extremity/Trunk Assessment Upper Extremity Assessment ?Upper Extremity Assessment: Overall WFL for tasks assessed ?  ?  ?  ?  ?  ? ?Vision Patient Visual Report: No change from baseline ?  ?  ?   ?   ? ?Cognition Arousal/Alertness: Awake/alert ?Behavior During Therapy: [facility] for tasks assessed/performed ?Overall Cognitive Status: Within Functional Limits for tasks assessed ?  ?  ?  ?  ?  ?  ?  ?  ?  ?  ?  ?  ?  ?  ?  ?  ?General Comments: Pt is A and O x 4 ?  ?  ?   ?   ?   ?   ? ? ?  Pertinent Vitals/ Pain       Pain Assessment ?Pain Assessment: 0-10 ?Pain Score: 3  ?Pain Location: R hip ?Pain Descriptors / Indicators: Grimacing, Guarding ?Pain Intervention(s): Limited activity within patient's tolerance, Monitored during session, Repositioned, Ice applied ? ? ?Frequency ? Min 2X/week  ? ? ? ? ?  ?Progress Toward Goals ? ?OT Goals(current goals can now be found in the care plan section) ? Progress towards OT goals: Progressing toward goals ? ?Acute Rehab OT Goals ?Patient Stated  Goal: to return home ?OT Goal Formulation: With patient/family ?Time For Goal Achievement: 12/09/21  ?Plan Discharge plan remains appropriate;Frequency remains appropriate   ? ?   ?AM-PAC OT "6 Clicks" Daily Activity     ?Outcome Measure ? ? Help from another person eating meals?: None ?Help from another person taking care of personal grooming?: None ?Help from another person toileting, which includes using toliet, bedpan, or urinal?: None ?Help from another person bathing (including washing, rinsing, drying)?: A Little ?Help from another person to put on and taking off regular upper body clothing?: None ?Help from another person to put on and taking off regular lower body clothing?: A Little ?6 Click Score: 22 ? ?  ?End of Session Equipment Utilized During Treatment: Rolling walker (2 wheels) ? ?OT Visit Diagnosis: Unsteadiness on feet (R26.81);Muscle weakness (generalized) (M62.81) ?  ?Activity Tolerance Patient tolerated treatment well ?  ?Patient Left in chair;with family/visitor present;with call bell/phone within reach ?  ?Nurse Communication Mobility status ?  ? ?   ? ?Time: 1941-7408 ?OT Time Calculation (min): 14 min ? ?Charges: OT General Charges ?$OT Visit: 1 Visit ?OT Treatments ?$Self Care/Home Management : 8-22 mins ? ?Darleen Crocker, [place], OTR/L , CBIS ?ascom [phone]  ?11/25/21, 11:27 AM  ?

## 2021-11-25 NOTE — Progress Notes (Signed)
Physical Therapy Treatment ?Patient Details ?Name: Kevin Mccullough ?MRN: [id number] ?DOB: 1951-07-27 ?Today's Date: 11/25/2021 ? ? ?History of Present Illness Pt admitted for R THA and is POD 0 at time of evaluation. ? ?  ?PT Comments  ? ? Pt was sitting in recliner upon arriving. He agrees to session and is cooperative and pleasant throughout. Pt was easily and safely able to stand ambulate and perform stairs without difficulty. During session and was able to have a successful BM. He is awaiting personal BSC and RW to be delivered. Pt is cleared from an acute PT standpoint for safe DC home with HHPT.   ?  ?Recommendations for follow up therapy are one component of a multi-disciplinary discharge planning process, led by the attending physician.  Recommendations may be updated based on patient status, additional functional criteria and insurance authorization. ? ?Follow Up Recommendations ? Home health PT ?  ?  ?Assistance Recommended at Discharge PRN  ?Patient can return home with the following A little help with walking and/or transfers;A little help with bathing/dressing/bathroom;Assistance with cooking/housework;Assist for transportation;Help with stairs or ramp for entrance ?  ?Equipment Recommendations ? None recommended by PT  ?  ?   ?Precautions / Restrictions Precautions ?Precautions: Fall;Posterior Hip ?Precaution Booklet Issued: Yes (comment) ?Restrictions ?Weight Bearing Restrictions: Yes ?RLE Weight Bearing: Weight bearing as tolerated  ?  ? ?Mobility ? Bed Mobility ?Overal bed mobility: Needs Assistance ?Bed Mobility: Supine to Sit ?  ?  ?Supine to sit: Min assist, Mod assist ?  ?  ?General bed mobility comments: in recliner pre/post session ?  ? ?Transfers ?Overall transfer level: Needs assistance ?Equipment used: Rolling walker (2 wheels) ?Transfers: Sit to/from Stand ?Sit to Stand: Supervision ?  ?  ?  ?  ?  ?General transfer comment: much imporvement in STS from recliner and BSC. no physical lifting  assistance required. ?  ? ?Ambulation/Gait ?Ambulation/Gait assistance: Supervision ?Gait Distance (Feet): 200 Feet ?Assistive device: Rolling walker (2 wheels) ?Gait Pattern/deviations: Antalgic, Step-to pattern, Decreased stance time - right, Decreased step length - left ?Gait velocity: decreased ?  ?  ?General Gait Details: Pt was easily and safely able to ambulate 200 ft with RW. no LOB or safety concern with use of RW. ? ? ?Stairs ?Stairs: Yes ?Stairs assistance: Supervision ?Stair Management: One rail Right, Step to pattern, Sideways ?Number of Stairs: 4 ?General stair comments: Pt was easily and safely able to ascend/descend 4 stair with +1 rail. ? ? ?  ?Balance Overall balance assessment: Needs assistance ?Sitting-balance support: Feet supported ?Sitting balance-Leahy Scale: Good ?  ?  ?Standing balance support: Bilateral upper extremity supported, Reliant on assistive device for balance ?Standing balance-Leahy Scale: Good ?  ?  ?  ?Cognition Arousal/Alertness: Awake/alert ?Behavior During Therapy: [facility] for tasks assessed/performed ?Overall Cognitive Status: Within Functional Limits for tasks assessed ?  ?   ?General Comments: Pt is A and O x 4 ?  ?  ? ?  ? ?Pertinent Vitals/Pain Pain Assessment ?Pain Assessment: 0-10 ?Pain Score: 4  ?Pain Location: R hip ?Pain Descriptors / Indicators: Grimacing, Guarding ?Pain Intervention(s): Limited activity within patient's tolerance, Monitored during session, Premedicated before session, Repositioned  ? ? ? ?PT Goals (current goals can now be found in the care plan section) Acute Rehab PT Goals ?Patient Stated Goal: to go home ?Progress towards PT goals: Progressing toward goals ? ?  ?Frequency ? ? ? BID ? ? ? ?  ?PT Plan Current plan remains appropriate  ? ? ?   ?  AM-PAC PT "6 Clicks" Mobility   ?Outcome Measure ? Help needed turning from your back to your side while in a flat bed without using bedrails?: A Little ?Help needed moving from lying on your back to sitting  on the side of a flat bed without using bedrails?: A Little ?Help needed moving to and from a bed to a chair (including a wheelchair)?: A Little ?Help needed standing up from a chair using your arms (e.g., wheelchair or bedside chair)?: A Little ?Help needed to walk in hospital room?: A Little ?Help needed climbing 3-5 steps with a railing? : A Little ?6 Click Score: 18 ? ?  ?End of Session   ?Activity Tolerance: Patient tolerated treatment well ?Patient left: in chair;with chair alarm set;with nursing/sitter in room ?Nurse Communication: Mobility status ?PT Visit Diagnosis: Muscle weakness (generalized) (M62.81);Difficulty in walking, not elsewhere classified (R26.2);Dizziness and giddiness (R42);Pain ?Pain - Right/Left: Right ?Pain - part of body: Hip ?  ? ? ?Time: 7544-9201 ?PT Time Calculation (min) (ACUTE ONLY): 33 min ? ?Charges:  $Gait Training: 23-37 mins ?$Therapeutic Activity: 8-22 mins          ?          ? ?Julaine Fusi PTA ?11/25/21, 9:54 AM  ? ?

## 2022-01-27 ENCOUNTER — Emergency Department: Payer: Medicare HMO

## 2022-01-27 ENCOUNTER — Encounter: Payer: Self-pay | Admitting: Emergency Medicine

## 2022-01-27 ENCOUNTER — Other Ambulatory Visit: Payer: Self-pay

## 2022-01-27 DIAGNOSIS — M25511 Pain in right shoulder: Secondary | ICD-10-CM | POA: Diagnosis present

## 2022-01-27 NOTE — ED Triage Notes (Signed)
Pt reports he had hip surgery 11/23/2021, reports he has been pulling himself up using his upper body, reports he developed right shoulder pain, reports pain is sharp and is intermittent. Pt talks in complete sentences no distress noted

## 2022-01-28 ENCOUNTER — Emergency Department
Admission: EM | Admit: 2022-01-28 | Discharge: 2022-01-28 | Disposition: A | Discharge status: Home / Self Care | Payer: Medicare HMO | Attending: Emergency Medicine | Admitting: Emergency Medicine

## 2022-01-28 DIAGNOSIS — M25511 Pain in right shoulder: Secondary | ICD-10-CM

## 2022-01-28 MED ORDER — TRAMADOL HCL 50 MG PO TABS: 100.0000 mg | ORAL_TABLET | Freq: Four times a day (QID) | ORAL | 0 refills | Status: DC | PRN

## 2022-01-28 NOTE — Discharge Instructions (Addendum)

## 2022-01-28 NOTE — ED Provider Notes (Addendum)
[facility] Provider Note    Event Date/Time   First MD Initiated Contact with Patient 01/28/22 [phone]     (approximate)   History   Shoulder Pain   HPI  Kevin Mccullough is a 71 y.o. male who presents for evaluation of pain in his right shoulder.  He recently had a right hip replacement by Dr. Marry Guan and has been needing to use his arm to lift himself and move around.  He said he feels like it "catches" when he moves it in certain ways and it was really hurting earlier tonight with sharp pains.  It is nonradiating and he has no chest pain or shortness of breath.  It seems to be directly related to moving his shoulder in certain ways.  However he states that after coming to the emergency department, it started to feel better, and he no longer has any significant discomfort.  He has had no significant traumatic injury such as a fall, and he has no numbness nor tingling in the extremity.  After the hip surgery, from which he has recovered well, he had some oxycodone, but he has run out.  He has a follow-up appointment with Dr. Marry Guan but not for a couple of weeks.     Physical Exam   Triage Vital Signs: ED Triage Vitals  Enc Vitals Group     BP 01/27/22 2253 126/79     Pulse Rate 01/27/22 2253 93     Resp 01/27/22 2253 15     Temp 01/27/22 2253 98.1 F (36.7 C)     Temp Source 01/27/22 2253 Oral     SpO2 01/27/22 2253 94 %     Weight 01/27/22 2254 95.3 kg (210 lb)     Height 01/27/22 2254 1.778 m ('5\' 10"'$ )     Head Circumference --      Peak Flow --      Pain Score 01/27/22 2254 1     Pain Loc --      Pain Edu? --      Excl. in [place]? --     Most recent vital signs: Vitals:   01/27/22 2253  BP: 126/79  Pulse: 93  Resp: 15  Temp: 98.1 F (36.7 C)  SpO2: 94%     General: Awake, no distress.  CV:  Good peripheral perfusion.  Resp:  Normal effort.  Abd:  No distention.  Other:  Ambulatory with the assistance of a cane.  He is able to abduct and  abduct his right shoulder without any difficulty at this time.  No specific point tenderness to palpation and no palpable deformity.  No restriction of range of motion.  However he says that if he moves it in certain ways he can feel a "clicking" inside the anterior part of his shoulder.   ED Results / Procedures / Treatments    RADIOLOGY I viewed and interpreted the patient's right shoulder x-rays and I see no evidence of fracture nor dislocation.    PROCEDURES:  Critical Care performed: No  Procedures   MEDICATIONS ORDERED IN ED: Medications - No data to display   IMPRESSION / MDM / [place] / ED COURSE  I reviewed the triage vital signs and the nursing notes.                              Differential diagnosis includes, but is not limited to, shoulder strain, dislocation,  fracture, internal derangement of the joint.  Patient's presentation is most consistent with acute, uncomplicated illness.  The patient's vital signs are within normal limits.  His physical exam is reassuring and his pain got better after coming here.  Although theoretically this could be an anginal equivalent, it is highly unlikely.  He can feel a "clicking" sensation within the shoulder and he has been under more strain with that extremity after his hip surgery.  X-rays are essentially normal.  The pain comes and goes when he uses it, not because of the exertion but because of the use of the joint.  We talked about it and I provided reassurance.  No indication for MRI in the emergent setting.  I recommended against immobilization or use of the sling as this might make it more stiff and sore.  Prescription for tramadol provided and the patient will follow-up with Dr. Marry Guan.  I gave my usual and customary return precautions.       FINAL CLINICAL IMPRESSION(S) / ED DIAGNOSES   Final diagnoses:  Acute pain of right shoulder     Rx / DC Orders   ED Discharge Orders          Ordered     traMADol (ULTRAM) 50 MG tablet  Every 6 hours PRN        01/28/22 0416             Note:  This document was prepared using Dragon voice recognition software and may include unintentional dictation errors.   Hinda Kehr, MD 01/28/22 [place], [place], MD 01/28/22 [phone]

## 2023-05-22 ENCOUNTER — Encounter: Payer: Self-pay | Admitting: Internal Medicine

## 2023-05-28 ENCOUNTER — Encounter: Payer: Self-pay | Admitting: Internal Medicine

## 2023-05-29 ENCOUNTER — Encounter
Admission: RE | Disposition: A | Discharge status: Home / Self Care | Payer: Self-pay | Source: Home / Self Care | Attending: Internal Medicine

## 2023-05-29 ENCOUNTER — Encounter: Payer: Self-pay | Admitting: Internal Medicine

## 2023-05-29 ENCOUNTER — Ambulatory Visit
Admission: RE | Admit: 2023-05-29 | Discharge: 2023-05-29 | Disposition: A | Discharge status: Home / Self Care | Payer: Medicare HMO | Attending: Internal Medicine | Admitting: Internal Medicine

## 2023-05-29 ENCOUNTER — Ambulatory Visit: Payer: Medicare HMO | Admitting: Certified Registered Nurse Anesthetist

## 2023-05-29 DIAGNOSIS — Z8719 Personal history of other diseases of the digestive system: Secondary | ICD-10-CM | POA: Insufficient documentation

## 2023-05-29 DIAGNOSIS — E119 Type 2 diabetes mellitus without complications: Secondary | ICD-10-CM | POA: Diagnosis not present

## 2023-05-29 DIAGNOSIS — Z87891 Personal history of nicotine dependence: Secondary | ICD-10-CM | POA: Diagnosis not present

## 2023-05-29 DIAGNOSIS — Z7984 Long term (current) use of oral hypoglycemic drugs: Secondary | ICD-10-CM | POA: Insufficient documentation

## 2023-05-29 DIAGNOSIS — Z09 Encounter for follow-up examination after completed treatment for conditions other than malignant neoplasm: Secondary | ICD-10-CM | POA: Diagnosis present

## 2023-05-29 DIAGNOSIS — K31A14 Gastric intestinal metaplasia without dysplasia, involving the cardia: Secondary | ICD-10-CM | POA: Insufficient documentation

## 2023-05-29 DIAGNOSIS — Z8711 Personal history of peptic ulcer disease: Secondary | ICD-10-CM | POA: Insufficient documentation

## 2023-05-29 HISTORY — PX: ESOPHAGOGASTRODUODENOSCOPY (EGD) WITH PROPOFOL: SHX5813

## 2023-05-29 HISTORY — PX: BIOPSY: SHX5522

## 2023-05-29 LAB — GLUCOSE, CAPILLARY: Glucose-Capillary: 124 mg/dL — ABNORMAL HIGH (ref 70–99)

## 2023-05-29 SURGERY — ESOPHAGOGASTRODUODENOSCOPY (EGD) WITH PROPOFOL
Anesthesia: General

## 2023-05-29 MED ORDER — LIDOCAINE HCL (CARDIAC) PF 100 MG/5ML IV SOSY
PREFILLED_SYRINGE | INTRAVENOUS | Status: DC | PRN
  Administered 2023-05-29: 100 mg via INTRAVENOUS

## 2023-05-29 MED ORDER — SODIUM CHLORIDE 0.9 % IV SOLN: INTRAVENOUS | Status: DC

## 2023-05-29 MED ORDER — PROPOFOL 10 MG/ML IV BOLUS
INTRAVENOUS | Status: DC | PRN
  Administered 2023-05-29 (×3): 30 mg via INTRAVENOUS
  Administered 2023-05-29: 70 mg via INTRAVENOUS

## 2023-05-29 NOTE — Transfer of Care (Signed)
Immediate Anesthesia Transfer of Care Note  Patient: Kevin Mccullough  Procedure(s) Performed: ESOPHAGOGASTRODUODENOSCOPY (EGD) WITH PROPOFOL BIOPSY  Patient Location: PACU and Endoscopy Unit  Anesthesia Type:General  Level of Consciousness: awake, drowsy, and patient cooperative  Airway & Oxygen Therapy: Patient Spontanous Breathing  Post-op Assessment: Report given to RN, Post -op Vital signs reviewed and stable, and Patient moving all extremities  Post vital signs: Reviewed and stable  Last Vitals:  Vitals Value Taken Time  BP 126/77 05/29/23 1054  Temp    Pulse 73 05/29/23 1055  Resp 21 05/29/23 1055  SpO2 92 % 05/29/23 1055  Vitals shown include unfiled device data.  Last Pain:  Vitals:   05/29/23 1008  TempSrc: Temporal  PainSc: 0-No pain         Complications: No notable events documented.

## 2023-05-29 NOTE — Interval H&P Note (Signed)
History and Physical Interval Note:  05/29/2023 10:26 AM  Kevin Mccullough  has presented today for surgery, with the diagnosis of barretts.  The various methods of treatment have been discussed with the patient and family. After consideration of risks, benefits and other options for treatment, the patient has consented to  Procedure(s): ESOPHAGOGASTRODUODENOSCOPY (EGD) WITH PROPOFOL (N/A) as a surgical intervention.  The patient's history has been reviewed, patient examined, no change in status, stable for surgery.  I have reviewed the patient's chart and labs.  Questions were answered to the patient's satisfaction.     [place], [place]
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
## 2023-05-29 NOTE — H&P (Signed)
Outpatient short stay form Pre-procedure 05/29/2023 10:25 AM Kevin Mccullough K. Norma Fredrickson, M.D.  Primary Physician: Kevin Riding, PA-C  Reason for visit:  GERD, Hx Barrett's esophagus  History of present illness:  72  y/o patient has a personal history of Barrett's esophagus without dysplasia. Patient denies hemetemesis, nausea, vomiting, dysphagia, weight loss. Takes PPI without significant side effects.      Current Facility-Administered Medications:    0.9 %  sodium chloride infusion, , Intravenous, Continuous, [place], Boykin Nearing, MD, Last Rate: 20 mL/hr at 05/29/23 1003, New Bag at 05/29/23 1003  Medications Prior to Admission  Medication Sig Dispense Refill Last Dose   atorvastatin (LIPITOR) 20 MG tablet Take 1 tablet (20 mg total) by mouth daily. 90 tablet 3 05/28/2023   Biotin 98119 MCG TABS Take 10,000 mcg by mouth daily.   Past Month   Cholecalciferol (VITAMIN D3) 2000 UNITS TABS Take 2,000 Units by mouth daily.    Past Week   Empagliflozin-metFORMIN HCl (SYNJARDY) 12-998 MG TABS Take 1 tablet by mouth daily. 30 tablet 3 05/28/2023   Multiple Vitamin (MULTIVITAMIN WITH MINERALS) TABS tablet Take 1 tablet by mouth daily.   Past Week   omeprazole (PRILOSEC) 20 MG capsule Take 20 mg by mouth daily.   05/28/2023   celecoxib (CELEBREX) 200 MG capsule Take 1 capsule (200 mg total) by mouth 2 (two) times daily. (Patient not taking: Reported on 05/29/2023) 90 capsule 0 Completed Course   diphenhydramine-acetaminophen (TYLENOL PM) 25-500 MG TABS tablet Take 2 tablets by mouth at bedtime.      enoxaparin (LOVENOX) 40 MG/0.4ML injection Inject 0.4 mLs (40 mg total) into the skin daily for 14 days. 5.6 mL 0    glucose blood test strip Check blood sugar 3 times a week 100 each 12    Lancets (ONETOUCH ULTRASOFT) lancets To check blood sugar 3 times a week 100 each 12    oxyCODONE (OXY IR/ROXICODONE) 5 MG immediate release tablet Take 1 tablet (5 mg total) by mouth every 4 (four) hours as needed for moderate  pain (pain score 4-6). 30 tablet 0    traMADol (ULTRAM) 50 MG tablet Take 2 tablets (100 mg total) by mouth every 6 (six) hours as needed for moderate pain or severe pain. 20 tablet 0      Allergies  Allergen Reactions   Naproxen Other (See Comments)    STOMACH PAIN   Lisinopril Rash     Past Medical History:  Diagnosis Date   Arthritis    Complication of anesthesia    PT WAS AWAKE DURING THE BEGINNING OF HIS SPLENECTOMY-WAS TOLD THAT THE SURGERY STARTED TO SOON AND ANESTHESIA HAD NOT FULLY KICKED IN    Diabetes mellitus without complication (HCC)    Hepatitis    History of bleeding ulcers    History of kidney stones    Pneumonia     Review of systems:  Otherwise negative.    Physical Exam  Gen: Alert, oriented. Appears stated age.  HEENT: Kevin Mccullough/AT. PERRLA. Lungs: CTA, no wheezes. CV: RR nl S1, S2. Abd: soft, benign, no masses. BS+ Ext: No edema. Pulses 2+    Planned procedures: Proceed with EGD. The patient understands the nature of the planned procedure, indications, risks, alternatives and potential complications including but not limited to bleeding, infection, perforation, damage to internal organs and possible oversedation/side effects from anesthesia. The patient agrees and gives consent to proceed.  Please refer to procedure notes for findings, recommendations and patient disposition/instructions.     [employer]  K. Norma Fredrickson, M.D. Gastroenterology 05/29/2023  10:25 AM

## 2023-05-29 NOTE — Anesthesia Postprocedure Evaluation (Signed)
Anesthesia Post Note  Patient: Kevin Mccullough  Procedure(s) Performed: ESOPHAGOGASTRODUODENOSCOPY (EGD) WITH PROPOFOL BIOPSY  Patient location during evaluation: Endoscopy Anesthesia Type: General Level of consciousness: awake and alert Pain management: pain level controlled Vital Signs Assessment: post-procedure vital signs reviewed and stable Respiratory status: spontaneous breathing, nonlabored ventilation and respiratory function stable Cardiovascular status: blood pressure returned to baseline and stable Postop Assessment: no apparent nausea or vomiting Anesthetic complications: no   No notable events documented.   Last Vitals:  Vitals:   05/29/23 1103 05/29/23 1113  BP: 114/76 113/79  Pulse: 70 67  Resp: 14 (!) 9  Temp: (!) 36.4 C   SpO2: 96% 97%    Last Pain:  Vitals:   05/29/23 1113  TempSrc:   PainSc: 0-No pain                 Foye Deer

## 2023-05-29 NOTE — Op Note (Signed)
[facility] Gastroenterology Patient Name: Kevin Mccullough Procedure Date: 05/29/2023 10:39 AM MRN: [id number] Account #: [id number] Date of Birth: [birth year] Admit Type: Outpatient Age: 72 Room: [facility] ENDO ROOM 1 Gender: Male Note Status: Finalized Instrument Name: Laurette Schimke [id number] Procedure:             Upper GI endoscopy Indications:           Surveillance for malignancy due to personal history of                         Barrett's esophagus Providers:             Boykin Nearing. Norma Fredrickson MD, MD Referring MD:          Wilford Corner (Referring MD) Medicines:             Propofol per Anesthesia Complications:         No immediate complications. Estimated blood loss:                         Minimal. Procedure:             Pre-Anesthesia Assessment:                        - The risks and benefits of the procedure and the                         sedation options and risks were discussed with the                         patient. All questions were answered and informed                         consent was obtained.                        - Patient identification and proposed procedure were                         verified prior to the procedure by the nurse. The                         procedure was verified in the procedure room.                        - ASA Grade Assessment: III - A patient with severe                         systemic disease.                        - After reviewing the risks and benefits, the patient                         was deemed in satisfactory condition to undergo the                         procedure.                        After obtaining informed consent,  the endoscope was                         passed under direct vision. Throughout the procedure,                         the patient's blood pressure, pulse, and oxygen                         saturations were monitored continuously. The Endoscope                         was  introduced through the mouth, and advanced to the                         third part of duodenum. The upper GI endoscopy was                         accomplished without difficulty. The patient tolerated                         the procedure well. Findings:      The esophagus and gastroesophageal junction were examined with white       light. There was no visual evidence of Barrett's esophagus. Mucosa was       biopsied with a cold forceps for histology in 4 quadrants at the       gastroesophageal junction. One specimen bottle was sent to pathology.      The exam of the esophagus was otherwise normal.      The stomach was normal.      The examined duodenum was normal. Impression:            - There is no endoscopic evidence of Barrett's                         esophagus. Biopsied.                        - Normal stomach.                        - Normal examined duodenum. Recommendation:        - Patient has a contact number available for                         emergencies. The signs and symptoms of potential                         delayed complications were discussed with the patient.                         Return to normal activities tomorrow. Written                         discharge instructions were provided to the patient.                        - Resume previous diet.                        -  Continue present medications.                        - Await pathology results.                        - Stop Barrett's surveillance if biopsies negative for                         intestinal metaplasia                        - Return to GI office PRN.                        - The findings and recommendations were discussed with                         the patient. Procedure Code(s):     --- Professional ---                        [phone], Esophagogastroduodenoscopy, flexible,                         transoral; with biopsy, single or multiple Diagnosis Code(s):     --- Professional ---                         K22.70, Barrett's esophagus without dysplasia CPT copyright 2022 American Medical Association. All rights reserved. The codes documented in this report are preliminary and upon coder review may  be revised to meet current compliance requirements. Stanton Kidney MD, MD 05/29/2023 10:56:38 AM This report has been signed electronically. Number of Addenda: 0 Note Initiated On: 05/29/2023 10:39 AM Estimated Blood Loss:  Estimated blood loss was minimal.      [facility]

## 2023-05-30 ENCOUNTER — Encounter: Payer: Self-pay | Admitting: Internal Medicine

## 2023-05-30 LAB — SURGICAL PATHOLOGY

## 2024-06-16 ENCOUNTER — Other Ambulatory Visit: Payer: Self-pay | Admitting: Student

## 2024-06-16 DIAGNOSIS — Z96642 Presence of left artificial hip joint: Secondary | ICD-10-CM

## 2024-06-16 DIAGNOSIS — M25552 Pain in left hip: Secondary | ICD-10-CM

## 2024-06-20 ENCOUNTER — Ambulatory Visit
Admission: RE | Admit: 2024-06-20 | Discharge: 2024-06-20 | Disposition: A | Discharge status: Home / Self Care | Source: Ambulatory Visit | Attending: Student | Admitting: Student

## 2024-06-20 DIAGNOSIS — M25552 Pain in left hip: Secondary | ICD-10-CM | POA: Diagnosis present

## 2024-06-20 DIAGNOSIS — Z96642 Presence of left artificial hip joint: Secondary | ICD-10-CM | POA: Diagnosis present

## 2024-08-01 ENCOUNTER — Inpatient Hospital Stay: Admission: RE | Admit: 2024-08-01

## 2024-08-01 NOTE — Discharge Instructions (Signed)
 Instructions after Total Hip Replacement   Kevin Mccullough P. Angie Fava., M.D.    Dept. of Orthopaedics & Sports Medicine [facility] [address] [place], [place]  [address]  Phone: [phone]   Fax: [phone]        [email or web address]        DIET: Drink plenty of non-alcoholic fluids. Resume your normal diet. Include foods high in fiber.  ACTIVITY:  You may use crutches or a walker with weight-bearing as tolerated, unless instructed otherwise. You may be weaned off of the walker or crutches by your Physical Therapist.  Do NOT reach below the level of your knees or cross your legs until allowed.    Continue doing gentle exercises. Exercising will reduce the pain and swelling, increase motion, and prevent muscle weakness.   Please continue to use the TED compression stockings for 6 weeks. You may remove the stockings at night, but should reapply them in the morning. Do not drive or operate any equipment until instructed.  WOUND CARE:  Continue to use ice packs periodically to reduce pain and swelling. The initial dressing (Aquacel) can remain in place for 7 days (see separate instructions). Keep the incision clean and dry. You may bathe or shower after the staples are removed at the first office visit following surgery.  MEDICATIONS: You may resume your regular medications. Please take the pain medication as prescribed on the medication. Do not take pain medication on an empty stomach. Unless instructed otherwise, you should take an enteric-coated aspirin 81 mg. TWICE a day. (This along with elevation will help reduce the possibility of blood clots/phlebitis in your operated leg.) Use a stool softener (such as Senokot-S or Colace) daily and a laxative (such as Miralax or Dulcolax) as needed to prevent constipation.  Do not drive or drink alcoholic beverages when taking pain medications.  CALL THE OFFICE FOR: Temperature above 101 degrees Excessive bleeding or drainage  on the dressing. Excessive swelling, coldness, or paleness of the toes. Persistent nausea and vomiting.  FOLLOW-UP:  You should have an appointment to return to the office in 6 weeks after surgery. Arrangements have been made for continuation of Physical Therapy (either home therapy or outpatient therapy).     [facility] Department Directory         [email or web address]       [email or web address]          Cardiology  Appointments: [place] - [phone]  Endocrinology  Appointments: [place] [phone] [place] - [phone]  Gastroenterology  Appointments: [place] [phone] [place] - [phone]        General Surgery   Appointments: [facility]  Internal Medicine/Family Medicine  Appointments: [facility] [place] - [phone] [place] - [phone]  Metabolic and Weigh Loss Surgery  Appointments: [facility]        Neurology  Appointments: [place] [phone] [place] - [phone]  Neurosurgery  Appointments: [place]  Obstetrics & Gynecology  Appointments: [place] [phone] [place] - [phone]        Pediatrics  Appointments: [facility] [phone] [place] - [phone]  Physiatry  Appointments: [place] [phone]  Physical Therapy  Appointments: [place] - [phone]        Podiatry  Appointments: [place] [phone] [place] - [phone]  Pulmonology  Appointments: [place]  Rheumatology  Appointments: [place] [phone]        [place] Location: [facility]  [address] [place], [place]  [address]  [place] Sport  Location: [facility]. [address] [place], [place]  [address]  [place] Location: [facility] [address] [place], [place]  [address]

## 2024-08-04 ENCOUNTER — Inpatient Hospital Stay
Admission: RE | Admit: 2024-08-04 | Discharge: 2024-08-04 | Attending: Orthopedic Surgery | Admitting: Orthopedic Surgery

## 2024-08-04 ENCOUNTER — Other Ambulatory Visit: Payer: Self-pay

## 2024-08-04 VITALS — BP 125/78 | HR 74 | Resp 16 | Ht 70.0 in | Wt 223.3 lb

## 2024-08-04 DIAGNOSIS — T84061A Wear of articular bearing surface of internal prosthetic left hip joint, initial encounter: Secondary | ICD-10-CM

## 2024-08-04 DIAGNOSIS — Z01812 Encounter for preprocedural laboratory examination: Secondary | ICD-10-CM

## 2024-08-04 DIAGNOSIS — E119 Type 2 diabetes mellitus without complications: Secondary | ICD-10-CM

## 2024-08-04 DIAGNOSIS — Z01818 Encounter for other preprocedural examination: Secondary | ICD-10-CM

## 2024-08-04 HISTORY — DX: Presence of right artificial hip joint: Z96.641

## 2024-08-04 HISTORY — DX: Personal history of other diseases of the digestive system: Z87.19

## 2024-08-04 HISTORY — DX: Alopecia areata, unspecified: L63.9

## 2024-08-04 HISTORY — DX: Bilateral primary osteoarthritis of hip: M16.0

## 2024-08-04 HISTORY — DX: Wear of articular bearing surface of internal prosthetic left hip joint, initial encounter: T84.061A

## 2024-08-04 HISTORY — DX: Pure hypercholesterolemia, unspecified: E78.00

## 2024-08-04 HISTORY — DX: Type 2 diabetes mellitus without complications: E11.9

## 2024-08-04 HISTORY — DX: Gastro-esophageal reflux disease without esophagitis: K21.9

## 2024-08-04 HISTORY — DX: Presence of left artificial hip joint: Z96.642

## 2024-08-04 HISTORY — DX: Dysphonia: R49.0

## 2024-08-04 HISTORY — DX: Barrett's esophagus without dysplasia: K22.70

## 2024-08-04 LAB — URINALYSIS, ROUTINE W REFLEX MICROSCOPIC
Bacteria, UA: NONE SEEN
Bilirubin Urine: NEGATIVE
Glucose, UA: >=500 mg/dL — AB
Hgb urine dipstick: NEGATIVE
Ketones, ur: NEGATIVE mg/dL
Leukocytes,Ua: NEGATIVE
Nitrite: NEGATIVE
Protein, ur: NEGATIVE mg/dL
RBC / HPF: 0 RBC/hpf (ref 0–5)
Specific Gravity, Urine: 1.005 (ref 1.005–1.030)
Squamous Epithelial / HPF: 0 /HPF (ref 0–5)
WBC, UA: 0 WBC/hpf (ref 0–5)
pH: 5 (ref 5.0–8.0)

## 2024-08-04 LAB — C-REACTIVE PROTEIN: CRP: <0.5 mg/dL (ref <1.0)

## 2024-08-04 LAB — SURGICAL PCR SCREEN
MRSA, PCR: NEGATIVE
Staphylococcus aureus: NEGATIVE

## 2024-08-04 LAB — TYPE AND SCREEN
ABO/RH(D): O POS
Antibody Screen: NEGATIVE

## 2024-08-04 LAB — SEDIMENTATION RATE: Sed Rate: 17 mm/h (ref 0–20)

## 2024-08-04 NOTE — Patient Instructions (Addendum)
 Your procedure is scheduled on:08-11-24 Monday Report to the Registration Desk on the 1st floor of the [facility].Then proceed to the 2nd floor Surgery Desk To find out your arrival time, please call [phone] between 1PM - 3PM on:08-08-24 Friday If your arrival time is 6:00 am, do not arrive before that time as the Medical Mall entrance doors do not open until 6:00 am.  REMEMBER: Instructions that are not followed completely may result in serious medical risk, up to and including death; or upon the discretion of your surgeon and anesthesiologist your surgery may need to be rescheduled.  Do not eat food after midnight the night before surgery.  No gum chewing or hard candies.  You may however, drink Water up to 2 hours before you are scheduled to arrive for your surgery. Do not drink anything within 2 hours of your scheduled arrival time.  In addition, your doctor has ordered for you to drink the provided:  Gatorade G2 Drinking this carbohydrate drink up to two hours before surgery helps to reduce insulin  resistance and improve patient outcomes. Please complete drinking 2 hours before scheduled arrival time.  One week prior to surgery:Stop NOW (08-04-24) Stop Anti-inflammatories (NSAIDS) such as Advil, Aleve , Ibuprofen, Motrin, Naproxen , Naprosyn  and Aspirin based products such as Excedrin, Goody's Powder, BC Powder. Stop ANY OVER THE COUNTER supplements until after surgery (Biotin, Vitamin D3, Multivitamin)  You may however, continue to take Tylenol  if needed for pain up until the day of surgery.  Stop JARDIANCE  3 days prior to surgery-Last dose will be on 08-07-24 Thursday  Stop your 81 mg Aspirin NOW (08-04-24)  Continue taking all of your other prescription medications up until the day of surgery.  Do NOT take any medication the day of surgery  No Alcohol for 24 hours before or after surgery.  No Smoking including e-cigarettes for 24 hours before surgery.  No chewable  tobacco products for at least 6 hours before surgery.  No nicotine patches on the day of surgery.  Do not use any recreational drugs for at least a week (preferably 2 weeks) before your surgery.  Please be advised that the combination of cocaine and anesthesia may have negative outcomes, up to and including death. If you test positive for cocaine, your surgery will be cancelled.  On the morning of surgery brush your teeth with toothpaste and water, you may rinse your mouth with mouthwash if you wish. Do not swallow any toothpaste or mouthwash.  Use CHG Soap as directed on instruction sheet.  Do not wear jewelry, make-up, hairpins, clips or nail polish.  For welded (permanent) jewelry: bracelets, anklets, waist bands, etc.  Please have this removed prior to surgery.  If it is not removed, there is a chance that hospital personnel will need to cut it off on the day of surgery.  Do not wear lotions, powders, or perfumes.   Do not shave body hair from the neck down 48 hours before surgery.  Contact lenses, hearing aids and dentures may not be worn into surgery.  Do not bring valuables to the hospital. [facility] is not responsible for any missing/lost belongings or valuables.   Notify your doctor if there is any change in your medical condition (cold, fever, infection).  Wear comfortable clothing (specific to your surgery type) to the hospital.  After surgery, you can help prevent lung complications by doing breathing exercises.  Take deep breaths and cough every 1-2 hours. Your doctor may order a device called an  Incentive Spirometer to help you take deep breaths. When coughing or sneezing, hold a pillow firmly against your incision with both hands. This is called "splinting." Doing this helps protect your incision. It also decreases belly discomfort.  If you are being admitted to the hospital overnight, leave your suitcase in the car. After surgery it may be brought to your  room.  In case of increased patient census, it may be necessary for you, the patient, to continue your postoperative care in the Same Day Surgery department.  If you are being discharged the day of surgery, you will not be allowed to drive home. You will need a responsible individual to drive you home and stay with you for 24 hours after surgery.   If you are taking public transportation, you will need to have a responsible individual with you.  Please call the Pre-admissions Testing Dept. at [phone] if you have any questions about these instructions.  Surgery Visitation Policy:  Patients having surgery or a procedure may have two visitors.  Children under the age of 30 must have an adult with them who is not the patient.  Inpatient Visitation:    Visiting hours are 7 a.m. to 8 p.m. Up to four visitors are allowed at one time in a patient room. The visitors may rotate out with other people during the day.  One visitor age 38 or older may stay with the patient overnight and must be in the room by 8 p.m.    Pre-operative 4 CHG Bath Instructions   You can play a key role in reducing the risk of infection after surgery. Your skin needs to be as free of germs as possible. You can reduce the number of germs on your skin by washing with CHG (chlorhexidine  gluconate) soap before surgery. CHG is an antiseptic soap that kills germs and continues to kill germs even after washing.   DO NOT use if you have an allergy to chlorhexidine /CHG or antibacterial soaps. If your skin becomes reddened or irritated, stop using the CHG and notify one of our RNs at [phone].   Please shower with the CHG soap starting 4 days before surgery using the following schedule:     Please keep in mind the following:  DO NOT shave, including legs and underarms, starting the day of your first shower.   You may shave your face at any point before/day of surgery.  Place clean sheets on your bed the day you  start using CHG soap. Use a clean washcloth (not used since being washed) for each shower. DO NOT sleep with pets once you start using the CHG.   CHG Shower Instructions:  If you choose to wash your hair and private area, wash first with your normal shampoo/soap.  After you use shampoo/soap, rinse your hair and body thoroughly to remove shampoo/soap residue.  Turn the water OFF and apply about 3 tablespoons (45 ml) of CHG soap to a CLEAN washcloth.  Apply CHG soap ONLY FROM YOUR NECK DOWN TO YOUR TOES (washing for 3-5 minutes)  DO NOT use CHG soap on face, private areas, open wounds, or sores.  Pay special attention to the area where your surgery is being performed.  If you are having back surgery, having someone wash your back for you may be helpful. Wait 2 minutes after CHG soap is applied, then you may rinse off the CHG soap.  Pat dry with a clean towel  Put on clean clothes/pajamas   If you  choose to wear lotion, please use ONLY the CHG-compatible lotions on the back of this paper.     Additional instructions for the day of surgery: DO NOT APPLY any lotions, deodorants, cologne, or perfumes.   Put on clean/comfortable clothes.  Brush your teeth.  Ask your nurse before applying any prescription medications to the skin.      CHG Compatible Lotions   Aveeno Moisturizing lotion  Cetaphil Moisturizing Cream  Cetaphil Moisturizing Lotion  Clairol Herbal Essence Moisturizing Lotion, Dry Skin  Clairol Herbal Essence Moisturizing Lotion, Extra Dry Skin  Clairol Herbal Essence Moisturizing Lotion, Normal Skin  Curel Age Defying Therapeutic Moisturizing Lotion with Alpha Hydroxy  Curel Extreme Care Body Lotion  Curel Soothing Hands Moisturizing Hand Lotion  Curel Therapeutic Moisturizing Cream, Fragrance-Free  Curel Therapeutic Moisturizing Lotion, Fragrance-Free  Curel Therapeutic Moisturizing Lotion, Original Formula  Eucerin Daily Replenishing Lotion  Eucerin Dry Skin Therapy  Plus Alpha Hydroxy Crme  Eucerin Dry Skin Therapy Plus Alpha Hydroxy Lotion  Eucerin Original Crme  Eucerin Original Lotion  Eucerin Plus Crme Eucerin Plus Lotion  Eucerin TriLipid Replenishing Lotion  Keri Anti-Bacterial Hand Lotion  Keri Deep Conditioning Original Lotion Dry Skin Formula Softly Scented  Keri Deep Conditioning Original Lotion, Fragrance Free Sensitive Skin Formula  Keri Lotion Fast Absorbing Fragrance Free Sensitive Skin Formula  Keri Lotion Fast Absorbing Softly Scented Dry Skin Formula  Keri Original Lotion  Keri Skin Renewal Lotion Keri Silky Smooth Lotion  Keri Silky Smooth Sensitive Skin Lotion  Nivea Body Creamy Conditioning Oil  Nivea Body Extra Enriched Lotion  Nivea Body Original Lotion  Nivea Body Sheer Moisturizing Lotion Nivea Crme  Nivea Skin Firming Lotion  NutraDerm 30 Skin Lotion  NutraDerm Skin Lotion  NutraDerm Therapeutic Skin Cream  NutraDerm Therapeutic Skin Lotion  ProShield Protective Hand Cream  Provon moisturizing lotion  How to Use an Incentive Spirometer An incentive spirometer is a tool that measures how well you are filling your lungs with each breath. Learning to take long, deep breaths using this tool can help you keep your lungs clear and active. This may help to reverse or lessen your chance of developing breathing (pulmonary) problems, especially infection. You may be asked to use a spirometer: After a surgery. If you have a lung problem or a history of smoking. After a long period of time when you have been unable to move or be active. If the spirometer includes an indicator to show the highest number that you have reached, your health care provider or respiratory therapist will help you set a goal. Keep a log of your progress as told by your health care provider. What are the risks? Breathing too quickly may cause dizziness or cause you to pass out. Take your time so you do not get dizzy or light-headed. If you are in pain,  you may need to take pain medicine before doing incentive spirometry. It is harder to take a deep breath if you are having pain. How to use your incentive spirometer  Sit up on the edge of your bed or on a chair. Hold the incentive spirometer so that it is in an upright position. Before you use the spirometer, breathe out normally. Place the mouthpiece in your mouth. Make sure your lips are closed tightly around it. Breathe in slowly and as deeply as you can through your mouth, causing the piston or the ball to rise toward the top of the chamber. Hold your breath for 3-5 seconds, or for as long  as possible. If the spirometer includes a coach indicator, use this to guide you in breathing. Slow down your breathing if the indicator goes above the marked areas. Remove the mouthpiece from your mouth and breathe out normally. The piston or ball will return to the bottom of the chamber. Rest for a few seconds, then repeat the steps 10 or more times. Take your time and take a few normal breaths between deep breaths so that you do not get dizzy or light-headed. Do this every 1-2 hours when you are awake. If the spirometer includes a goal marker to show the highest number you have reached (best effort), use this as a goal to work toward during each repetition. After each set of 10 deep breaths, cough a few times. This will help to make sure that your lungs are clear. If you have an incision on your chest or abdomen from surgery, place a pillow or a rolled-up towel firmly against the incision when you cough. This can help to reduce pain while taking deep breaths and coughing. General tips When you are able to get out of bed: Walk around often. Continue to take deep breaths and cough in order to clear your lungs. Keep using the incentive spirometer until your health care provider says it is okay to stop using it. If you have been in the hospital, you may be told to keep using the spirometer at  home. Contact a health care provider if: You are having difficulty using the spirometer. You have trouble using the spirometer as often as instructed. Your pain medicine is not giving enough relief for you to use the spirometer as told. You have a fever. Get help right away if: You develop shortness of breath. You develop a cough with bloody mucus from the lungs. You have fluid or blood coming from an incision site after you cough. Summary An incentive spirometer is a tool that can help you learn to take long, deep breaths to keep your lungs clear and active. You may be asked to use a spirometer after a surgery, if you have a lung problem or a history of smoking, or if you have been inactive for a long period of time. Use your incentive spirometer as instructed every 1-2 hours while you are awake. If you have an incision on your chest or abdomen, place a pillow or a rolled-up towel firmly against your incision when you cough. This will help to reduce pain. Get help right away if you have shortness of breath, you cough up bloody mucus, or blood comes from your incision when you cough. This information is not intended to replace advice given to you by your health care provider. Make sure you discuss any questions you have with your health care provider. Document Revised: 06/22/2023 Document Reviewed: 06/22/2023 Elsevier Patient Education  2024 [employer]  Preoperative Educational Videos for Total Hip, Knee and Shoulder Replacements  To better prepare for surgery, please view our videos that explain the physical activity and discharge planning required to have the best surgical recovery at [facility].  [email or web address]  Questions? Call [phone] or email [email or web address]        Community Resource Directory to address health-related social needs:  [email or web address]

## 2024-08-10 NOTE — H&P (Signed)
 ORTHOPAEDIC HISTORY & PHYSICAL Kevin Mccullough, Kevin Mccullough, Kevin Mccullough - 08/05/2024 2:15 PM EST Formatting of this note is different from the original. Chief Complaint: Chief Complaint Patient presents with Pre-op Exam Scheduled for Left Hip Revision 08/11/24 with Dr. Mardee  The patient is a 73 y.o. male who presents for history and physical for left hip revision. Patient underwent left hip hemiarthroplasty 23 years ago. He is scheduled on 08/11/2024 for a left total hip revision to a total hip arthroplasty. Patient has x-rays showing eccentric wear of his left hip hemiarthroplasty. He feels a grinding sensation and vibration when he walks along the lateral aspect of the left hip. He denies any numbness tingling or back pain. No fevers chills or drainage. Patient has seen Dr. Mardee and agreed and consented to a left partial hip revision to total hip arthroplasty  Medications: Current Outpatient Medications Medication Sig Dispense Refill aspirin  81 MG EC tablet Take 81 mg by mouth once daily atorvastatin  (LIPITOR) 40 MG tablet Take 1 tablet by mouth once daily 90 tablet 1 biotin 10,000 mcg Cap Take 1 capsule by mouth once daily cholecalciferol  (VITAMIN D3) 2,000 unit tablet Take 2,000 Units by mouth once daily diphenhydrAMINE -acetaminophen  (TYLENOL  PM) 25-500 mg per tablet Take 2 tablets by mouth at bedtime empagliflozin  (JARDIANCE ) 10 mg tablet Take 1 tablet (10 mg total) by mouth once daily 30 tablet 11 multivitamin with iron-minerals (SUPER THERA VITE M) tablet Take 1 tablet by mouth once daily ONETOUCH ULTRASOFT lancets Use 1 each 3 (three) times a week ONETOUCH VERIO TEST STRIPS test strip 1 each (1 strip total) 3 (three) times a week 100 each 5 blood glucose diagnostic test strip 1 each (1 strip total) 3 (three) times daily Use as instructed. 100 each 12 blood glucose meter kit as directed 1 each 0 lancing device with lancets kit Use 1 each 3 (three) times daily Use as instructed. 100 each  12  No current facility-administered medications for this visit.  Allergies: Allergies Allergen Reactions Lisinopril  Rash Naproxen  Other (See Comments) H/O ulcers and Barrett's esophagus  Past Medical History: Past Medical History: Diagnosis Date Acute peptic ulcer with hemorrhage 2008 Barrett's esophagus without dysplasia 10/27/2018 Chicken pox Type 2 diabetes mellitus (CMS/HHS-HCC) 03/2021  Past Surgical History: Past Surgical History: Procedure Laterality Date UNLISTED LAPAROSCOPY PROCEDURE SPLEEN 1983 SPLENECTOMY 1983 Left hip bipolar hemiarthroplasty 2002 COLONOSCOPY 03/02/2005 Normal Colon: CBF 02/2015; Recall Ltr mailed 01/12/2015 (dw); OV made 04/27/2015 @ 11:15am w/Kim Moishe NP (dw) EGD 04/13/2008 Gastric Ulcer EGD 06/30/2008 No repeat per RTE COLONOSCOPY 10/28/2015 Hyperplastic Polyp: CBF 10/2025 EGD 10/28/2015 Barrett's Esophagus: CBF 10/2018: Recall ltr mailed EGD 10/03/2019 Barrett's Esophagus/Repeat 52yrs/TKT Right total hip arthroplasty 11/23/2021 Dr Mardee EGD @ [facility] 05/29/2023 Reflux esophagitis/No repeat/TKT Back Surgery [id number], 2011 CHOLECYSTECTOMY SHOULDER SURGERY Left TONSILLECTOMY WISDOM TEETH  Social History: Social History  Socioeconomic History Marital status: Married Spouse name: Rock Number of children: 2 Years of education: 12 Highest education level: High school graduate Occupational History Comment: Retired Tobacco Use Smoking status: Former Current packs/day: 0.00 Types: Cigarettes Quit date: 08/29/1987 Years since quitting: 36.9 Smokeless tobacco: Never Vaping Use Vaping status: Never Used Substance and Sexual Activity Alcohol use: No Alcohol/week: 0.0 standard drinks of alcohol Drug use: No Sexual activity: Yes Partners: Female  Social Drivers of Catering Manager Strain: Low Risk (08/05/2024) Overall Financial Resource Strain (CARDIA) Difficulty of Paying Living Expenses: Not hard at all Food  Insecurity: No Food Insecurity (08/05/2024) Hunger Vital Sign Worried About Running Out of Food in the  Last Year: Never true Ran Out of Food in the Last Year: Never true Transportation Needs: No Transportation Needs (08/05/2024) PRAPARE - Contractor (Medical): No Lack of Transportation (Non-Medical): No Housing Stability: Low Risk (08/05/2024) Housing Stability Vital Sign Unable to Pay for Housing in the Last Year: No Number of Times Moved in the Last Year: 0 Homeless in the Last Year: No  Family History: Family History Problem Relation Name Age of Onset Liver disease Mother Diabetes type II Mother Asthma Father Arthritis Sister  Review of Systems: A comprehensive 14 point ROS was performed, reviewed, and the pertinent orthopaedic findings are documented in the HPI.  Exam BP 124/84  Ht 177.8 cm (5' 10)  Wt 100.6 kg (221 lb 12.8 oz)  BMI 31.82 kg/m  General: Well developed, well nourished, no apparent distress, normal affect, normal gait slow antalgic gait no assistive device.  HEENT: Head normocephalic, atraumatic, PERRL.  Abdomen: Soft, non tender, non distended, Bowel sounds present.  Heart: Examination of the heart reveals regular, rate, and rhythm. There is no murmur noted on ascultation. There is a normal apical pulse.  Lungs: Lungs are clear to auscultation. There is no wheeze, rhonchi, or crackles. There is normal expansion of bilateral chest walls.  Left Hip: Pelvic tilt: Negative Limb lengths: Equal with the patient standing Soft tissue swelling: Negative Erythema: Negative Crepitance: Negative Tenderness: Greater trochanter is nontender to palpation. Mild pain is elicited by axial compression or extremes of rotation. Atrophy: No atrophy. Fair to good hip flexor and abductor strength. Range of Motion: EXT/FLEX: 10/0/90 ADD/ABD: 20/0/20 IR/ER: 10/0/20  Vascular: Peripheral pulses are palpable. Good capillary refill. No gross  pretibial or ankle edema. Homans test is negative.  Neurologic: Awake, alert, and oriented. Sensory function is intact to pinprick and light touch. Motor strength is judged to be 5/5. Motor coordination is grossly within normal limits. No apparent clonus. No tremor.  X-rays: Left hip x-rays reviewed by me today from 06/10/2024 show left hip hemiarthroplasty in good positioning. Femoral component is noncemented. There is eccentric changes along the bipolar component concerning for polyethylene wear. No evidence of dislocation. Mild superior acetabular spur.  CT OF THE LEFT HIP WITHOUT CONTRAST  TECHNIQUE: Multidetector CT imaging of the left hip was performed according to the standard protocol. Multiplanar CT image reconstructions were also generated.  RADIATION DOSE REDUCTION: This exam was performed according to the departmental dose-optimization program which includes automated exposure control, adjustment of the mA and/or kV according to patient size and/or use of iterative reconstruction technique.  COMPARISON: X-ray 01/30/2017  FINDINGS: Bones/Joint/Cartilage  Postsurgical changes from left total hip arthroplasty. The femoral component appears superiorly located within the acetabular cup suggesting a degree of liner wear. No dislocation. No periprosthetic lucency or fracture is seen. No periprosthetic fluid collections. No lytic or sclerotic bone lesion.  Ligaments  Suboptimally assessed by CT.  Muscles and Tendons  No acute musculotendinous abnormality by CT.  Soft tissues  No soft tissue swelling or fluid collection. No left inguinal lymphadenopathy.  IMPRESSION: Postsurgical changes from left total hip arthroplasty. The femoral component appears superiorly located within the acetabular cup suggesting a degree of liner wear. No periprosthetic lucency or fracture is seen.  Electronically Signed By: Mabel Converse D.O. On: 06/24/2024  09:23  Impression: Left hip bipolar hemiarthroplasty with evidence of polyethylene wear  Plan: 74 year old male with left hip hemiarthroplasty greater than 20 years ago. He has x-rays show an eccentric wear of the polyethylene bipolar component. Femoral component appears  to be intact with no signs of loosening or periprosthetic fracture. He is having grinding sensation in the left hip with occasional pain. His discomfort is interfering with quality of life and activities day living. Risks, benefits, complications of a left hip hemiarthroplasty conversion to a left total hip replacement have been discussed with the patient. Patient has agreed and consented to the procedure with Dr. Mardee on 08/11/2024.   Electronically signed by Charlene Debby Bruckner, PA at 08/05/2024 4:59 PM EST

## 2024-08-11 ENCOUNTER — Other Ambulatory Visit: Payer: Self-pay

## 2024-08-11 ENCOUNTER — Inpatient Hospital Stay

## 2024-08-11 ENCOUNTER — Encounter: Admission: RE | Disposition: A | Payer: Self-pay | Attending: Orthopedic Surgery

## 2024-08-11 ENCOUNTER — Inpatient Hospital Stay
Admission: RE | Admit: 2024-08-11 | Discharge: 2024-08-12 | DRG: 468 | Disposition: A | Attending: Orthopedic Surgery | Admitting: Orthopedic Surgery

## 2024-08-11 ENCOUNTER — Encounter: Payer: Self-pay | Admitting: Orthopedic Surgery

## 2024-08-11 DIAGNOSIS — T84061A Wear of articular bearing surface of internal prosthetic left hip joint, initial encounter: Secondary | ICD-10-CM

## 2024-08-11 DIAGNOSIS — Z886 Allergy status to analgesic agent status: Secondary | ICD-10-CM | POA: Diagnosis not present

## 2024-08-11 DIAGNOSIS — Z888 Allergy status to other drugs, medicaments and biological substances status: Secondary | ICD-10-CM | POA: Diagnosis not present

## 2024-08-11 DIAGNOSIS — Z8711 Personal history of peptic ulcer disease: Secondary | ICD-10-CM

## 2024-08-11 DIAGNOSIS — E119 Type 2 diabetes mellitus without complications: Secondary | ICD-10-CM

## 2024-08-11 DIAGNOSIS — Z79899 Other long term (current) drug therapy: Secondary | ICD-10-CM | POA: Diagnosis not present

## 2024-08-11 DIAGNOSIS — Z96649 Presence of unspecified artificial hip joint: Secondary | ICD-10-CM

## 2024-08-11 DIAGNOSIS — M25552 Pain in left hip: Secondary | ICD-10-CM | POA: Diagnosis present

## 2024-08-11 DIAGNOSIS — Z7984 Long term (current) use of oral hypoglycemic drugs: Secondary | ICD-10-CM | POA: Diagnosis not present

## 2024-08-11 DIAGNOSIS — Z825 Family history of asthma and other chronic lower respiratory diseases: Secondary | ICD-10-CM | POA: Diagnosis not present

## 2024-08-11 DIAGNOSIS — Z87891 Personal history of nicotine dependence: Secondary | ICD-10-CM | POA: Diagnosis not present

## 2024-08-11 DIAGNOSIS — Y792 Prosthetic and other implants, materials and accessory orthopedic devices associated with adverse incidents: Secondary | ICD-10-CM | POA: Diagnosis present

## 2024-08-11 DIAGNOSIS — Z01818 Encounter for other preprocedural examination: Secondary | ICD-10-CM

## 2024-08-11 DIAGNOSIS — Z9081 Acquired absence of spleen: Secondary | ICD-10-CM | POA: Diagnosis not present

## 2024-08-11 DIAGNOSIS — Z833 Family history of diabetes mellitus: Secondary | ICD-10-CM | POA: Diagnosis not present

## 2024-08-11 DIAGNOSIS — E7800 Pure hypercholesterolemia, unspecified: Secondary | ICD-10-CM | POA: Diagnosis present

## 2024-08-11 DIAGNOSIS — Z7982 Long term (current) use of aspirin: Secondary | ICD-10-CM | POA: Diagnosis not present

## 2024-08-11 DIAGNOSIS — K219 Gastro-esophageal reflux disease without esophagitis: Secondary | ICD-10-CM | POA: Diagnosis present

## 2024-08-11 DIAGNOSIS — Z96641 Presence of right artificial hip joint: Secondary | ICD-10-CM | POA: Diagnosis present

## 2024-08-11 DIAGNOSIS — Z8261 Family history of arthritis: Secondary | ICD-10-CM | POA: Diagnosis not present

## 2024-08-11 HISTORY — PX: TOTAL HIP REVISION: SHX763

## 2024-08-11 LAB — GLUCOSE, CAPILLARY
Glucose-Capillary: 133 mg/dL — ABNORMAL HIGH (ref 70–99)
Glucose-Capillary: 159 mg/dL — ABNORMAL HIGH (ref 70–99)
Glucose-Capillary: 169 mg/dL — ABNORMAL HIGH (ref 70–99)

## 2024-08-11 SURGERY — TOTAL HIP REVISION
Anesthesia: General | Site: Hip | Laterality: Left

## 2024-08-11 MED ORDER — FERROUS SULFATE 325 (65 FE) MG PO TABS
325.0000 mg | ORAL_TABLET | Freq: Two times a day (BID) | ORAL | Status: DC
  Administered 2024-08-11 – 2024-08-12 (×2): 325 mg via ORAL
  Filled 2024-08-11 (×2): qty 1

## 2024-08-11 MED ORDER — GABAPENTIN 300 MG PO CAPS
ORAL_CAPSULE | ORAL | Status: AC
  Filled 2024-08-11: qty 1

## 2024-08-11 MED ORDER — FENTANYL CITRATE (PF) 100 MCG/2ML IJ SOLN
INTRAMUSCULAR | Status: AC
  Filled 2024-08-11: qty 2

## 2024-08-11 MED ORDER — CHLORHEXIDINE GLUCONATE 0.12 % MT SOLN
15.0000 mL | Freq: Once | OROMUCOSAL | Status: AC
  Administered 2024-08-11: 10:00:00 15 mL via OROMUCOSAL

## 2024-08-11 MED ORDER — GLYCOPYRROLATE 0.2 MG/ML IJ SOLN
INTRAMUSCULAR | Status: DC | PRN
  Administered 2024-08-11: 13:00:00 .2 mg via INTRAVENOUS

## 2024-08-11 MED ORDER — EMPAGLIFLOZIN 10 MG PO TABS
10.0000 mg | ORAL_TABLET | Freq: Every day | ORAL | Status: DC
  Administered 2024-08-12: 11:00:00 10 mg via ORAL
  Filled 2024-08-11: qty 1

## 2024-08-11 MED ORDER — HYDROMORPHONE HCL 1 MG/ML IJ SOLN
INTRAMUSCULAR | Status: DC | PRN
  Administered 2024-08-11 (×2): .25 mg via INTRAVENOUS
  Administered 2024-08-11: 14:00:00 .5 mg via INTRAVENOUS

## 2024-08-11 MED ORDER — INSULIN ASPART 100 UNIT/ML IJ SOLN
0.0000 [IU] | Freq: Three times a day (TID) | INTRAMUSCULAR | Status: DC
  Administered 2024-08-12: 08:00:00 2 [IU] via SUBCUTANEOUS
  Filled 2024-08-11: qty 2
  Filled 2024-08-11: qty 3

## 2024-08-11 MED ORDER — HYDROMORPHONE HCL 1 MG/ML IJ SOLN
INTRAMUSCULAR | Status: AC
  Filled 2024-08-11: qty 1

## 2024-08-11 MED ORDER — CEFAZOLIN SODIUM-DEXTROSE 2-4 GM/100ML-% IV SOLN
2.0000 g | Freq: Four times a day (QID) | INTRAVENOUS | Status: AC
  Administered 2024-08-11 – 2024-08-12 (×2): 2 g via INTRAVENOUS
  Filled 2024-08-11 (×2): qty 100

## 2024-08-11 MED ORDER — LIDOCAINE HCL (PF) 2 % IJ SOLN
INTRAMUSCULAR | Status: AC
  Filled 2024-08-11: qty 5

## 2024-08-11 MED ORDER — EPHEDRINE 5 MG/ML INJ
INTRAVENOUS | Status: AC
  Filled 2024-08-11: qty 5

## 2024-08-11 MED ORDER — SUGAMMADEX SODIUM 200 MG/2ML IV SOLN
INTRAVENOUS | Status: DC | PRN
  Administered 2024-08-11: 15:00:00 200 mg via INTRAVENOUS

## 2024-08-11 MED ORDER — PROPOFOL 10 MG/ML IV BOLUS
INTRAVENOUS | Status: DC | PRN
  Administered 2024-08-11: 12:00:00 150 mg via INTRAVENOUS

## 2024-08-11 MED ORDER — SODIUM CHLORIDE 0.9 % IR SOLN
Status: DC | PRN
  Administered 2024-08-11: 15:00:00 3000 mL

## 2024-08-11 MED ORDER — INSULIN ASPART 100 UNIT/ML IJ SOLN: 0.0000 [IU] | Freq: Every day | INTRAMUSCULAR | Status: DC

## 2024-08-11 MED ORDER — FLEET ENEMA RE ENEM: 1.0000 | ENEMA | Freq: Once | RECTAL | Status: DC | PRN

## 2024-08-11 MED ORDER — DEXMEDETOMIDINE HCL IN NACL 80 MCG/20ML IV SOLN
INTRAVENOUS | Status: DC | PRN
  Administered 2024-08-11: 12:00:00 4 ug via INTRAVENOUS

## 2024-08-11 MED ORDER — ROCURONIUM BROMIDE 10 MG/ML (PF) SYRINGE
PREFILLED_SYRINGE | INTRAVENOUS | Status: AC
  Filled 2024-08-11: qty 10

## 2024-08-11 MED ORDER — CHLORHEXIDINE GLUCONATE 0.12 % MT SOLN
OROMUCOSAL | Status: AC
  Filled 2024-08-11: qty 15

## 2024-08-11 MED ORDER — ONDANSETRON HCL 4 MG/2ML IJ SOLN: 4.0000 mg | Freq: Four times a day (QID) | INTRAMUSCULAR | Status: DC | PRN

## 2024-08-11 MED ORDER — SODIUM CHLORIDE 0.9 % IV SOLN: INTRAVENOUS | Status: DC

## 2024-08-11 MED ORDER — ATORVASTATIN CALCIUM 10 MG PO TABS
20.0000 mg | ORAL_TABLET | Freq: Every day | ORAL | Status: DC
  Administered 2024-08-12: 10:00:00 20 mg via ORAL
  Filled 2024-08-11: qty 2

## 2024-08-11 MED ORDER — MAGNESIUM HYDROXIDE 400 MG/5ML PO SUSP: 30.0000 mL | Freq: Every day | ORAL | Status: DC

## 2024-08-11 MED ORDER — CEFAZOLIN SODIUM-DEXTROSE 2-4 GM/100ML-% IV SOLN
INTRAVENOUS | Status: AC
  Filled 2024-08-11: qty 100

## 2024-08-11 MED ORDER — ACETAMINOPHEN 325 MG PO TABS: 325.0000 mg | ORAL_TABLET | Freq: Four times a day (QID) | ORAL | Status: DC | PRN

## 2024-08-11 MED ORDER — CHLORHEXIDINE GLUCONATE 4 % EX SOLN
60.0000 mL | Freq: Once | CUTANEOUS | Status: AC
  Administered 2024-08-11: 10:00:00 4 via TOPICAL

## 2024-08-11 MED ORDER — OXYCODONE HCL 5 MG PO TABS: 10.0000 mg | ORAL_TABLET | ORAL | Status: DC | PRN

## 2024-08-11 MED ORDER — LACTATED RINGERS IV SOLN: INTRAVENOUS | Status: DC | PRN

## 2024-08-11 MED ORDER — ONDANSETRON HCL 4 MG/2ML IJ SOLN
INTRAMUSCULAR | Status: DC | PRN
  Administered 2024-08-11: 15:00:00 4 mg via INTRAVENOUS

## 2024-08-11 MED ORDER — ORAL CARE MOUTH RINSE: 15.0000 mL | Freq: Once | OROMUCOSAL | Status: AC

## 2024-08-11 MED ORDER — SENNOSIDES-DOCUSATE SODIUM 8.6-50 MG PO TABS
1.0000 | ORAL_TABLET | Freq: Two times a day (BID) | ORAL | Status: DC
  Administered 2024-08-11 – 2024-08-12 (×2): 1 via ORAL
  Filled 2024-08-11 (×2): qty 1

## 2024-08-11 MED ORDER — METOCLOPRAMIDE HCL 10 MG PO TABS
10.0000 mg | ORAL_TABLET | Freq: Three times a day (TID) | ORAL | Status: DC
  Administered 2024-08-11 – 2024-08-12 (×4): 10 mg via ORAL
  Filled 2024-08-11 (×4): qty 1

## 2024-08-11 MED ORDER — DEXAMETHASONE SOD PHOSPHATE PF 10 MG/ML IJ SOLN
8.0000 mg | Freq: Once | INTRAMUSCULAR | Status: AC
  Administered 2024-08-11: 10:00:00 8 mg via INTRAVENOUS

## 2024-08-11 MED ORDER — PHENYLEPHRINE 80 MCG/ML (10ML) SYRINGE FOR IV PUSH (FOR BLOOD PRESSURE SUPPORT)
PREFILLED_SYRINGE | INTRAVENOUS | Status: AC
  Filled 2024-08-11: qty 10

## 2024-08-11 MED ORDER — HYDROMORPHONE HCL 1 MG/ML IJ SOLN: 0.5000 mg | INTRAMUSCULAR | Status: DC | PRN

## 2024-08-11 MED ORDER — ACETAMINOPHEN 10 MG/ML IV SOLN
INTRAVENOUS | Status: DC | PRN
  Administered 2024-08-11: 13:00:00 1000 mg via INTRAVENOUS

## 2024-08-11 MED ORDER — GABAPENTIN 300 MG PO CAPS
300.0000 mg | ORAL_CAPSULE | Freq: Once | ORAL | Status: AC
  Administered 2024-08-11: 10:00:00 300 mg via ORAL

## 2024-08-11 MED ORDER — ONDANSETRON HCL 4 MG PO TABS: 4.0000 mg | ORAL_TABLET | Freq: Four times a day (QID) | ORAL | Status: DC | PRN

## 2024-08-11 MED ORDER — 0.9 % SODIUM CHLORIDE (POUR BTL) OPTIME
TOPICAL | Status: DC | PRN
  Administered 2024-08-11: 13:00:00 500 mL

## 2024-08-11 MED ORDER — ASPIRIN 81 MG PO CHEW: 81.0000 mg | CHEWABLE_TABLET | Freq: Two times a day (BID) | ORAL | Status: DC

## 2024-08-11 MED ORDER — FENTANYL CITRATE (PF) 100 MCG/2ML IJ SOLN
INTRAMUSCULAR | Status: DC | PRN
  Administered 2024-08-11 (×2): 50 ug via INTRAVENOUS

## 2024-08-11 MED ORDER — LIDOCAINE HCL (CARDIAC) PF 100 MG/5ML IV SOSY
PREFILLED_SYRINGE | INTRAVENOUS | Status: DC | PRN
  Administered 2024-08-11: 12:00:00 50 mg via INTRAVENOUS

## 2024-08-11 MED ORDER — PHENOL 1.4 % MT LIQD: 1.0000 | OROMUCOSAL | Status: DC | PRN

## 2024-08-11 MED ORDER — GLYCOPYRROLATE 0.2 MG/ML IJ SOLN
INTRAMUSCULAR | Status: AC
  Filled 2024-08-11: qty 1

## 2024-08-11 MED ORDER — EPHEDRINE SULFATE-NACL 50-0.9 MG/10ML-% IV SOSY
PREFILLED_SYRINGE | INTRAVENOUS | Status: DC | PRN
  Administered 2024-08-11 (×5): 5 mg via INTRAVENOUS

## 2024-08-11 MED ORDER — TRANEXAMIC ACID-NACL 1000-0.7 MG/100ML-% IV SOLN
INTRAVENOUS | Status: AC
  Filled 2024-08-11: qty 100

## 2024-08-11 MED ORDER — OXYCODONE HCL 5 MG PO TABS
5.0000 mg | ORAL_TABLET | Freq: Once | ORAL | Status: AC | PRN
  Administered 2024-08-11: 17:00:00 5 mg via ORAL

## 2024-08-11 MED ORDER — TRANEXAMIC ACID-NACL 1000-0.7 MG/100ML-% IV SOLN
1000.0000 mg | INTRAVENOUS | Status: AC
  Administered 2024-08-11: 12:00:00 1000 mg via INTRAVENOUS
  Filled 2024-08-11: qty 100

## 2024-08-11 MED ORDER — ROCURONIUM BROMIDE 100 MG/10ML IV SOLN
INTRAVENOUS | Status: DC | PRN
  Administered 2024-08-11: 12:00:00 60 mg via INTRAVENOUS
  Administered 2024-08-11: 14:00:00 15 mg via INTRAVENOUS
  Administered 2024-08-11: 14:00:00 10 mg via INTRAVENOUS
  Administered 2024-08-11: 13:00:00 15 mg via INTRAVENOUS

## 2024-08-11 MED ORDER — DIPHENHYDRAMINE HCL 12.5 MG/5ML PO ELIX: 12.5000 mg | ORAL_SOLUTION | ORAL | Status: DC | PRN

## 2024-08-11 MED ORDER — ACETAMINOPHEN 10 MG/ML IV SOLN
INTRAVENOUS | Status: AC
  Filled 2024-08-11: qty 100

## 2024-08-11 MED ORDER — CEFAZOLIN SODIUM-DEXTROSE 2-4 GM/100ML-% IV SOLN
2.0000 g | INTRAVENOUS | Status: AC
  Administered 2024-08-11: 12:00:00 2 g via INTRAVENOUS

## 2024-08-11 MED ORDER — TRANEXAMIC ACID-NACL 1000-0.7 MG/100ML-% IV SOLN
1000.0000 mg | Freq: Once | INTRAVENOUS | Status: AC
  Administered 2024-08-11: 16:00:00 1000 mg via INTRAVENOUS

## 2024-08-11 MED ORDER — PANTOPRAZOLE SODIUM 40 MG PO TBEC
40.0000 mg | DELAYED_RELEASE_TABLET | Freq: Two times a day (BID) | ORAL | Status: DC
  Administered 2024-08-11 – 2024-08-12 (×2): 40 mg via ORAL
  Filled 2024-08-11 (×2): qty 1

## 2024-08-11 MED ORDER — OXYCODONE HCL 5 MG PO TABS: 5.0000 mg | ORAL_TABLET | ORAL | Status: DC | PRN

## 2024-08-11 MED ORDER — OXYCODONE HCL 5 MG/5ML PO SOLN: 5.0000 mg | Freq: Once | ORAL | Status: AC | PRN

## 2024-08-11 MED ORDER — CELECOXIB 200 MG PO CAPS
200.0000 mg | ORAL_CAPSULE | Freq: Two times a day (BID) | ORAL | Status: DC
  Administered 2024-08-11 – 2024-08-12 (×2): 200 mg via ORAL
  Filled 2024-08-11 (×2): qty 1

## 2024-08-11 MED ORDER — ONDANSETRON HCL 4 MG/2ML IJ SOLN
INTRAMUSCULAR | Status: AC
  Filled 2024-08-11: qty 2

## 2024-08-11 MED ORDER — FENTANYL CITRATE (PF) 100 MCG/2ML IJ SOLN
25.0000 ug | INTRAMUSCULAR | Status: DC | PRN
  Administered 2024-08-11 (×2): 25 ug via INTRAVENOUS
  Administered 2024-08-11: 16:00:00 50 ug via INTRAVENOUS

## 2024-08-11 MED ORDER — SURGIPHOR WOUND IRRIGATION SYSTEM - OPTIME
TOPICAL | Status: DC | PRN
  Administered 2024-08-11: 15:00:00 450 mL via TOPICAL

## 2024-08-11 MED ORDER — ACETAMINOPHEN 10 MG/ML IV SOLN
1000.0000 mg | Freq: Four times a day (QID) | INTRAVENOUS | Status: DC
  Administered 2024-08-11 – 2024-08-12 (×3): 1000 mg via INTRAVENOUS
  Filled 2024-08-11 (×3): qty 100

## 2024-08-11 MED ORDER — OXYCODONE HCL 5 MG PO TABS
ORAL_TABLET | ORAL | Status: AC
  Filled 2024-08-11: qty 1

## 2024-08-11 MED ORDER — TRAMADOL HCL 50 MG PO TABS
50.0000 mg | ORAL_TABLET | ORAL | Status: DC | PRN
  Administered 2024-08-11 – 2024-08-12 (×2): 100 mg via ORAL
  Filled 2024-08-11 (×2): qty 2

## 2024-08-11 MED ORDER — BISACODYL 10 MG RE SUPP: 10.0000 mg | Freq: Every day | RECTAL | Status: DC | PRN

## 2024-08-11 MED ORDER — PHENYLEPHRINE 80 MCG/ML (10ML) SYRINGE FOR IV PUSH (FOR BLOOD PRESSURE SUPPORT)
PREFILLED_SYRINGE | INTRAVENOUS | Status: DC | PRN
  Administered 2024-08-11 (×2): 160 ug via INTRAVENOUS
  Administered 2024-08-11 (×2): 80 ug via INTRAVENOUS
  Administered 2024-08-11: 12:00:00 160 ug via INTRAVENOUS
  Administered 2024-08-11 (×4): 80 ug via INTRAVENOUS
  Administered 2024-08-11: 13:00:00 160 ug via INTRAVENOUS

## 2024-08-11 MED ORDER — ALUM & MAG HYDROXIDE-SIMETH 200-200-20 MG/5ML PO SUSP: 30.0000 mL | ORAL | Status: DC | PRN

## 2024-08-11 MED ORDER — MENTHOL 3 MG MT LOZG: 1.0000 | LOZENGE | OROMUCOSAL | Status: DC | PRN

## 2024-08-11 SURGICAL SUPPLY — 50 items
Acetabular liner IMPLANT
BLADE CLIPPER SURG (BLADE) IMPLANT
BLADE SAW 90X25X1.19 OSCILLAT (BLADE) ×1 IMPLANT
BRUSH SCRUB EZ PLAIN DRY (MISCELLANEOUS) ×1 IMPLANT
CUP SECTOR GRIPTON 58MM (Orthopedic Implant) IMPLANT
DRAPE INCISE IOBAN 66X60 STRL (DRAPES) ×1 IMPLANT
DRAPE SHEET LG 3/4 BI-LAMINATE (DRAPES) ×1 IMPLANT
DRSG AQUACEL AG ADV 3.5X 4 (GAUZE/BANDAGES/DRESSINGS) IMPLANT
DRSG AQUACEL AG ADV 3.5X14 (GAUZE/BANDAGES/DRESSINGS) ×1 IMPLANT
DRSG MEPILEX SACRM 8.7X9.8 (GAUZE/BANDAGES/DRESSINGS) ×1 IMPLANT
DRSG TEGADERM 4X4.75 (GAUZE/BANDAGES/DRESSINGS) ×1 IMPLANT
DRSG XEROFORM 1X8 (GAUZE/BANDAGES/DRESSINGS) IMPLANT
DURAPREP 26ML APPLICATOR (WOUND CARE) ×2 IMPLANT
ELECT CAUTERY BLADE 6.4 (BLADE) ×1 IMPLANT
ELECTRODE REM PT RTRN 9FT ADLT (ELECTROSURGICAL) ×1 IMPLANT
EVACUATOR 1/8 PVC DRAIN (DRAIN) ×1 IMPLANT
GAUZE XEROFORM 1X8 LF (GAUZE/BANDAGES/DRESSINGS) ×1 IMPLANT
GLOVE BIOGEL M STRL SZ7.5 (GLOVE) ×3 IMPLANT
GLOVE BIOGEL PI IND STRL 7.0 (GLOVE) ×1 IMPLANT
GLOVE SRG 8 PF TXTR STRL LF DI (GLOVE) ×1 IMPLANT
GLOVE SURG SYN 7.0 PF PI (GLOVE) ×3 IMPLANT
GOWN STRL REUS W/ TWL LRG LVL3 (GOWN DISPOSABLE) ×2 IMPLANT
GOWN TOGA ZIPPER T7+ PEEL AWAY (MISCELLANEOUS) ×1 IMPLANT
HANDLE YANKAUER SUCT OPEN TIP (MISCELLANEOUS) ×1 IMPLANT
HEAD FEM OFFSET 40 -2 (Head) IMPLANT
HOLDER FOLEY CATH W/STRAP (MISCELLANEOUS) ×1 IMPLANT
HOOD PEEL AWAY T7 (MISCELLANEOUS) ×1 IMPLANT
KIT PEG BOARD PINK (KITS) ×1 IMPLANT
KIT TURNOVER KIT A (KITS) ×1 IMPLANT
LINER ACET ALTRX 40X58 0D (Liner) IMPLANT
MANIFOLD NEPTUNE II (INSTRUMENTS) ×2 IMPLANT
NS IRRIG 500ML POUR BTL (IV SOLUTION) ×1 IMPLANT
PACK HIP PROSTHESIS (MISCELLANEOUS) ×1 IMPLANT
PENCIL SMOKE EVACUATOR COATED (MISCELLANEOUS) ×1 IMPLANT
PIN STEIN THRED 5/32 (Pin) ×1 IMPLANT
SCREW 6.5MMX25MM (Screw) IMPLANT
SOL .9 NS 3000ML IRR UROMATIC (IV SOLUTION) ×1 IMPLANT
SOLN STERILE WATER BTL 1000 ML (IV SOLUTION) ×1 IMPLANT
SOLUTION IRRIG SURGIPHOR (IV SOLUTION) ×1 IMPLANT
SPONGE DRAIN TRACH 4X4 STRL 2S (GAUZE/BANDAGES/DRESSINGS) ×1 IMPLANT
STAPLER SKIN PROX 35W (STAPLE) ×1 IMPLANT
SUT ETHIBOND #5 BRAIDED 30INL (SUTURE) ×1 IMPLANT
SUT VIC AB 0 CT1 36 (SUTURE) ×2 IMPLANT
SUT VIC AB 1 CT1 36 (SUTURE) ×2 IMPLANT
SUT VIC AB 2-0 CT1 TAPERPNT 27 (SUTURE) ×1 IMPLANT
TAPE CLOTH 3X10 WHT NS LF (GAUZE/BANDAGES/DRESSINGS) ×1 IMPLANT
TIP FAN IRRIG PULSAVAC PLUS (DISPOSABLE) ×1 IMPLANT
TOWEL OR 17X26 4PK STRL BLUE (TOWEL DISPOSABLE) IMPLANT
TRAP FLUID SMOKE EVACUATOR (MISCELLANEOUS) ×1 IMPLANT
TRAY FOLEY MTR SLVR 16FR STAT (SET/KITS/TRAYS/PACK) ×1 IMPLANT

## 2024-08-11 NOTE — Progress Notes (Signed)
 PT Cancellation Note  Patient Details Name: Kevin Mccullough MRN: [id number] DOB: [birth year]   Cancelled Treatment:    Reason Eval/Treat Not Completed: Patient found to be on a heating pad and visibly shaking with cold, nurse at bedside and aware.  Pt requested to hold PT eval this date and to work with PT on 12/16, Dr. Mardee notified.  Will attempt to see pt at a future date/time as medically appropriate.    CHARM [name] PT, DPT 08/11/2024, 4:38 PM
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
## 2024-08-11 NOTE — Progress Notes (Signed)
 Patient has mobility impairment for daily activities. A rolling walker will resolve this and the patient is safe to use it  [employer]. Myrtle Haller, Jr., M.D.

## 2024-08-11 NOTE — Transfer of Care (Signed)
 Immediate Anesthesia Transfer of Care Note  Patient: Kevin Mccullough  Procedure(s) Performed: TOTAL HIP REVISION (Left: Hip)  Patient Location: PACU  Anesthesia Type:General  Level of Consciousness: drowsy  Airway & Oxygen Therapy: Patient Spontanous Breathing and Patient connected to face mask oxygen  Post-op Assessment: Report given to RN and Post -op Vital signs reviewed and stable  Post vital signs: Reviewed and stable  Last Vitals:  Vitals Value Taken Time  BP 111/62 08/11/24 15:42  Temp 35.8 1542  Pulse 90 08/11/24 15:45  Resp 14 08/11/24 15:43  SpO2 100 % 08/11/24 15:45  Vitals shown include unfiled device data.  Last Pain:  Vitals:   08/11/24 1010  PainSc: 0-No pain         Complications: No notable events documented.

## 2024-08-11 NOTE — Interval H&P Note (Signed)
 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  The various methods of treatment have been discussed with the patient and family. After consideration of risks, benefits and other options for treatment, the patient has consented to  Procedures with comments: TOTAL HIP REVISION (Left) - CONVERSION OF A HEMIARTHROPLASTY TOA TOTAL HIP ARTHROPLASTY. as a surgical intervention.  The patient's history has been reviewed, patient examined, no change in status, stable for surgery.  I have reviewed the patient's chart and labs.  Questions were answered to the patient's satisfaction.     Prabhjot Piscitello P Dariana Garbett

## 2024-08-11 NOTE — OR Nursing (Signed)
 Removed 1 Bipolar femoral head 52mm [id number] and 1 head ball 28+5 lot # [id number]

## 2024-08-11 NOTE — Op Note (Signed)
 OPERATIVE NOTE  DATE OF SURGERY:  08/11/2024  PATIENT NAME:  Kevin Mccullough   DOB: 1950-12-06  MRN: [id number]  PRE-OPERATIVE DIAGNOSIS: Failure (polyethylene wear) of a left hip bipolar hemiarthroplasty   POST-OPERATIVE DIAGNOSIS:  Same  PROCEDURE: Conversion of a left hip bipolar hemiarthroplasty to a left total hip arthroplasty  SURGEON:  Lynwood SHAUNNA Mardee Mickey. M.D.  ASSISTANT:  Juliana Costa, PA-C (present and scrubbed throughout the case, critical for assistance with exposure, retraction, instrumentation, and closure)  ANESTHESIA: general  ESTIMATED BLOOD LOSS: 75 mL  FLUIDS REPLACED: 1100 mL of crystalloid  DRAINS: 2 medium Hemovac drains  IMPLANTS UTILIZED: DePuy 58 mm OD Pinnacle GRIPTION Sector acetabular component, 6.5 mm x 25 mm cancellous screw, +4 mm 10 degree Pinnacle Altrx polyethylene insert, and a 40 mm M-Spec -2 mm hip ball  INDICATIONS FOR SURGERY: Kevin Mccullough is a 73 y.o. year old male who underwent a left hip bipolar hemiarthroplasty approximately 20 years ago.  Recent x-rays demonstrated findings consistent with eccentric positioning of the head and polyethylene wear.  After discussion of the risks and benefits of surgical intervention, the patient expressed understanding of the risks benefits and agree with plans for conversion of the left hip bipolar hemiarthroplasty to a left total hip arthroplasty.   The risks, benefits, and alternatives were discussed at length including but not limited to the risks of infection, bleeding, nerve injury, stiffness, blood clots, the need for revision surgery, limb length inequality, dislocation, cardiopulmonary complications, among others, and they were willing to proceed.  PROCEDURE IN DETAIL: The patient was brought into the operating room and, after adequate general anesthesia was achieved, the patient was placed in a right lateral decubitus position. Axillary roll was placed and all bony prominences were well-padded. The  patient's left hip was cleaned and prepped with alcohol and DuraPrep and draped in the usual sterile fashion. A timeout was performed as per usual protocol. A lateral curvilinear incision was made in line with the previous surgical scar gently curving towards the posterior superior iliac spine. The IT band was incised in line with the skin incision and the fibers of the gluteus maximus were split in line.  The sciatic nerve was palpated and protected throughout the procedure.  A dense posterior pseudocapsule was encountered.  A T type posterior capsulotomy was performed.  The femoral neck and the bipolar head were visualized.  Careful debridement of the fibrotic tissue was performed so as to better mobilize the bipolar head. The bipolar head was then dislocated posteriorly.  Debridement was performed around the collar and proximal portion of the femoral component.  The femoral component was felt to be stable.  A bone tamp was used to disimpact the bipolar femoral head.  There did appear to be evidence of polyethylene wear with excessive movement of the inner ball.  The trunnion was inspected and felt to be in good condition.  The femoral neck was then positioned superiorly and anteriorly so as to better visualize the acetabulum.  Again, additional debridement of the fibrotic tissue was performed using electrocautery and rongeurs.  The acetabulum was essentially completely denuded of articular cartilage.  The acetabulum was reamed in sequential fashion up to a 57 mm diameter. Good punctate bleeding bone was encountered. A 58 mm Pinnacle GRIPTION Sector acetabular component was positioned and impacted into place. Good scratch fit was appreciated.  Additional fixation was provided using a 6.5 mm x 25 mm cancellous screw.  A +4 mm neutral polyethylene trial was  inserted.  Attention was then directed to the proximal femur.  A 40 mm hip ball with a -2 mm neck length.  Good equalization of the limb lengths was  appreciated and reasonably good stability was appreciated.  However, was elected to trial with a +4 mm 10 degree offset with the high side position at the 4 o'clock position. Good equalization of limb lengths and hip offset was appreciated and excellent stability was noted both anteriorly and posteriorly. Trial components were removed. The acetabular shell was irrigated with copious amounts of normal saline with antibiotic solution and suctioned dry. A +4 mm 10 degree Pinnacle Altrx polyethylene insert was positioned with the high side at the 4 o'clock position and impacted into place. A trial reduction was again performed with a 40 mm hip ball with a -2 mm neck length. Again, good equalization of limb lengths was appreciated and excellent stability appreciated both anteriorly and posteriorly. The hip was then dislocated and the trial hip ball was removed. The Morse taper was cleaned and dried. A 40 mm M-SPEC hip ball with a -2 mm neck length was placed on the trunnion and impacted into place. The hip was then reduced and placed through range of motion. Excellent stability was appreciated both anteriorly and posteriorly.  The wound was irrigated with copious amounts of normal saline followed by 450 ml of Surgiphor and suctioned dry. Good hemostasis was appreciated. The posterior capsulotomy was repaired using #5 Ethibond. Piriformis tendon was reapproximated to the undersurface of the gluteus medius tendon using #5 Ethibond. The IT band was reapproximated using interrupted sutures of #1 Vicryl. Subcutaneous tissue was approximated using first #0 Vicryl followed by #2-0 Vicryl. The skin was closed with skin staples.  The patient tolerated the procedure well and was transported to the recovery room in stable condition.   Lynwood SHAUNNA Mardee Mickey., M.D.

## 2024-08-11 NOTE — Progress Notes (Signed)
 Patient is not able to walk the distance required to go the bathroom, or he/she is unable to safely negotiate stairs required to access the bathroom.  A 3in1 BSC will alleviate this problem   Amenda Duclos P. Angie Fava M.D.

## 2024-08-11 NOTE — Progress Notes (Signed)
 No SCD machines in PACU.  Called portable equipment to request SCD machine.

## 2024-08-11 NOTE — Anesthesia Procedure Notes (Signed)
 Procedure Name: Intubation Date/Time: 08/11/2024 12:01 PM  Performed by: Trudy Rankin LABOR, CRNAPre-anesthesia Checklist: Patient identified, Patient being monitored, Timeout performed, Emergency Drugs available and Suction available Patient Re-evaluated:Patient Re-evaluated prior to induction Oxygen Delivery Method: Circle System Utilized Preoxygenation: Pre-oxygenation with 100% oxygen Induction Type: IV induction and Rapid sequence Laryngoscope Size: Mac, 3 and McGrath Grade View: Grade I Tube type: Oral Tube size: 7.5 mm Number of attempts: 1 Airway Equipment and Method: Stylet and Video-laryngoscopy Placement Confirmation: ETT inserted through vocal cords under direct vision, positive ETCO2 and breath sounds checked- equal and bilateral Secured at: 22 cm Tube secured with: Tape Dental Injury: Teeth and Oropharynx as per pre-operative assessment

## 2024-08-12 ENCOUNTER — Other Ambulatory Visit: Payer: Self-pay

## 2024-08-12 ENCOUNTER — Encounter: Payer: Self-pay | Admitting: Orthopedic Surgery

## 2024-08-12 LAB — GLUCOSE, CAPILLARY
Glucose-Capillary: 135 mg/dL — ABNORMAL HIGH (ref 70–99)
Glucose-Capillary: 114 mg/dL — ABNORMAL HIGH (ref 70–99)

## 2024-08-12 MED ORDER — ACETAMINOPHEN 325 MG PO TABS: 325.0000 mg | ORAL_TABLET | Freq: Four times a day (QID) | ORAL | Status: AC | PRN

## 2024-08-12 MED ORDER — TRAMADOL HCL 50 MG PO TABS
50.0000 mg | ORAL_TABLET | ORAL | 2 refills | Status: AC | PRN
  Filled 2024-08-12: qty 30, 5d supply, fill #0

## 2024-08-12 MED ORDER — ASPIRIN 81 MG PO CHEW
81.0000 mg | CHEWABLE_TABLET | Freq: Two times a day (BID) | ORAL | Status: DC
  Administered 2024-08-12: 13:00:00 81 mg via ORAL
  Filled 2024-08-12: qty 1

## 2024-08-12 MED ORDER — ASPIRIN 81 MG PO TBEC: 81.0000 mg | DELAYED_RELEASE_TABLET | Freq: Every day | ORAL | Status: DC

## 2024-08-12 MED ORDER — ASPIRIN 81 MG PO CHEW: 81.0000 mg | CHEWABLE_TABLET | Freq: Two times a day (BID) | ORAL | Status: AC

## 2024-08-12 MED ORDER — OXYCODONE HCL 5 MG PO TABS
5.0000 mg | ORAL_TABLET | ORAL | 0 refills | Status: AC | PRN
  Filled 2024-08-12: qty 30, 5d supply, fill #0

## 2024-08-12 MED ORDER — CELECOXIB 200 MG PO CAPS
200.0000 mg | ORAL_CAPSULE | Freq: Two times a day (BID) | ORAL | 2 refills | Status: AC
  Filled 2024-08-12: qty 30, 15d supply, fill #0

## 2024-08-12 NOTE — Discharge Summary (Signed)
 Physician Discharge Summary  Patient ID: Kevin Mccullough MRN: [id number] DOB/AGE: 1951-04-21 73 y.o.  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, initial encounter [T84.061A] S/P revision of total hip [Z96.649]   Discharge Diagnoses: Patient Active Problem List   Diagnosis Date Noted   S/P revision of total hip 08/11/2024   Controlled type 2 diabetes mellitus without complication, without long-term current use of insulin  (HCC) 06/19/2023   Hx of total hip arthroplasty, right 11/23/2021   Dyshidrotic eczema 05/26/2021   Barrett's esophagus without dysplasia 10/27/2018   Choledocholithiasis with cholecystitis 10/06/2016   Calculus of bile duct without cholecystitis and without obstruction    Choledocholithiasis 09/21/2016   Gallstones 09/06/2016   AA (alopecia areata) 03/24/2015   Acid indigestion 03/24/2015   H/O peptic ulcer 03/24/2015   HLD (hyperlipidemia) 03/24/2015   Arthritis, degenerative 03/24/2015   Hypercholesterolemia without hypertriglyceridemia 03/24/2015    Past Medical History:  Diagnosis Date   AA (alopecia areata)    Arthritis    Barrett's esophagus    Complication of anesthesia    pt was awake at the beginning of splenectomy   GERD (gastroesophageal reflux disease)    Hepatitis    History of bleeding ulcers    History of gallstones    History of hemiarthroplasty of left hip    History of kidney stones    Hoarseness    pt seen by ENT who states pt has weakened vocal cords   Hx of total hip arthroplasty, right    Hypercholesterolemia    Osteoarthritis, hip, bilateral    Pneumonia    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 male who was admitted 08/11/2024 with a  diagnosis of S/P revision of total hip and went to the operating room on 08/11/2024 and underwent the below named procedures.    Surgeries: Procedures: TOTAL HIP REVISION on 08/11/2024 Patient tolerated the surgery well. Taken to PACU where she was stabilized and then transferred to the orthopedic floor.  Started on ASA 81mg  BID. Heels elevated on bed with rolled towels. No evidence of DVT. Negative Homan. Physical therapy started on day #1 for gait training and transfer. OT started day #1 for ADL and assisted devices.  Patient's IV ,and hemovac was d/c on day #1. Foley was removed shortly after surgery.   Implants: DePuy 58 mm OD [employer] GRIPTION Sector acetabular component, 6.5 mm x 25 mm cancellous screw, +4 mm 10 degree [employer] Altrx polyethylene insert, and a 40 mm M-Spec -2 mm hip ball   He was given perioperative antibiotics:  Anti-infectives (From admission, onward)    Start     Dose/Rate Route Frequency Ordered Stop   08/11/24 1800  ceFAZolin  (ANCEF ) IVPB 2g/100 mL premix        2 g 200 mL/hr over 30 Minutes Intravenous Every 6 hours 08/11/24 1652 08/12/24 0054   08/11/24 0930  ceFAZolin  (ANCEF ) IVPB 2g/100 mL premix        2 g 200 mL/hr over 30 Minutes Intravenous On call to O.R. 08/11/24 9072 08/11/24 1205     .  He was given sequential compression devices, early ambulation, and ASA for DVT prophylaxis.  He benefited maximally from the hospital stay and there were no complications.  Recent vital signs:  Vitals:   08/12/24 0000 08/12/24 0449  BP: 118/64 94/61  Pulse: 88 74  Resp: 16 14  Temp: (!) 97.4 F (36.3 C) (!) 97.2 F (36.2 C)  SpO2: 96% 95%    Recent laboratory studies:  Lab Results  Component Value Date   HGB 13.8 11/11/2021   HGB 15.4 04/05/2021   HGB 15.4 02/23/2020   Lab Results  Component Value Date   WBC 7.9 11/11/2021   PLT 324 11/11/2021   No results found for: INR Lab Results  Component Value Date   NA 141 11/11/2021   K 4.2  11/11/2021   CL 108 11/11/2021   CO2 23 11/11/2021   BUN 26 (H) 11/11/2021   CREATININE 1.52 (H) 11/11/2021   GLUCOSE 116 (H) 11/11/2021    Discharge Medications:   Allergies as of 08/12/2024       Reactions   Naproxen  Other (See Comments)   STOMACH PAIN   Lisinopril  Rash        Medication List     STOP taking these medications    aspirin  EC 81 MG tablet Replaced by: aspirin  81 MG chewable tablet       TAKE these medications    acetaminophen  325 MG tablet Commonly known as: TYLENOL  Take 1-2 tablets (325-650 mg total) by mouth every 6 (six) hours as needed for mild pain (pain score 1-3) (or temp > 100.5).   aspirin  81 MG chewable tablet Chew 1 tablet (81 mg total) by mouth 2 (two) times daily. Start taking on: August 13, 2024 Replaces: aspirin  EC 81 MG tablet   atorvastatin  20 MG tablet Commonly known as: LIPITOR Take 1 tablet (20 mg total) by mouth daily.   Biotin 10000 MCG Tabs Take 10,000 mcg by mouth daily.   celecoxib  200 MG capsule Commonly known as: CELEBREX  Take 1 capsule (200 mg total) by mouth 2 (two) times daily.   glucose blood test strip Check blood sugar 3 times a week   Jardiance  10 MG Tabs tablet Generic drug: empagliflozin  Take 10 mg by mouth daily.   multivitamin with minerals Tabs tablet Take 1 tablet by mouth daily.   onetouch ultrasoft lancets To check blood sugar 3 times a week   oxyCODONE  5 MG immediate release tablet Commonly known as: Oxy IR/ROXICODONE  Take 1 tablet (5 mg total) by mouth every 4 (four) hours as needed for moderate pain (pain score 4-6) (pain score 4-6).   traMADol  50 MG tablet Commonly known as: ULTRAM  Take 1-2 tablets (50-100 mg total) by mouth every 4 (four) hours as needed for moderate pain (pain score 4-6).   Vitamin D3 50 MCG (2000 UT) Tabs Take 2,000 Units by mouth daily.               Durable Medical Equipment  (From admission, onward)           Start     Ordered   08/11/24  1532  DME Walker rolling  Once       Question:  Patient needs a walker to treat with the following condition  Answer:  S/P total hip arthroplasty   08/11/24 1531   08/11/24 1532  DME Bedside commode  Once       Comments: Patient is not able to walk the distance required to go the bathroom, or he/she is unable to safely negotiate stairs required to access the bathroom.  A 3in1 BSC will alleviate this problem  Question:  Patient needs a bedside  commode to treat with the following condition  Answer:  S/P total hip arthroplasty   08/11/24 1531            Diagnostic Studies: DG HIP UNILAT W OR W/O PELVIS 2-3 VIEWS LEFT Result Date: 08/11/2024 CLINICAL DATA:  Hip arthroplasty. EXAM: DG HIP (WITH OR WITHOUT PELVIS) 2-3V LEFT COMPARISON:  Pelvic radiograph dated 01/30/2017. FINDINGS: Bilateral total hip arthroplasties. The arthroplasty components appear intact and in anatomic alignment. There is no acute fracture or dislocation. The bones are osteopenic. Surgical drains and cutaneous staples noted over the left hip. IMPRESSION: Bilateral total hip arthroplasties. No acute fracture or dislocation. Electronically Signed   By: Vanetta Chou M.D.   On: 08/11/2024 16:20    Disposition: Discharge disposition: 01-Home or Self Care      Cleared for discharge    Follow-up Information     Hooten, Lynwood SQUIBB, MD Follow up on 09/23/2024.   Specialty: Orthopedic Surgery Why: at 2:45pm Contact information: [address] [facility] [address] [phone]                  Signed: Vick Dunk, PA-C 08/12/2024, 8:16 AM

## 2024-08-12 NOTE — Progress Notes (Signed)
 DISCHARGE NOTE:   Pt dc with IV removed and dc instructions given. Pt has both TED hose on and in place. Pt given ice pack for at home use. Pt voices no question or concerns at this time. Pt wheeled down to medical mall entrance by staff. Pt's wife provided transportation.

## 2024-08-12 NOTE — Progress Notes (Signed)
 Subjective: 1 Day Post-Op Procedures (LRB): TOTAL HIP REVISION (Left) Patient reports pain as mild.   Patient is well, and has had no acute complaints or problems We will start therapy today.  Plan is to go Home after hospital stay.  Objective: Vital signs in last 24 hours: Temp:  [96.9 F (36.1 C)-98.1 F (36.7 C)] 97.2 F (36.2 C) (12/16 0449) Pulse Rate:  [74-99] 74 (12/16 0449) Resp:  [11-21] 14 (12/16 0449) BP: (94-138)/(61-80) 94/61 (12/16 0449) SpO2:  [95 %-100 %] 95 % (12/16 0449) Weight:  [100.7 kg] 100.7 kg (12/15 1010)  Intake/Output from previous day:  Intake/Output Summary (Last 24 hours) at 08/12/2024 0810 Last data filed at 08/12/2024 0515 Gross per 24 hour  Intake 2034.01 ml  Output 695 ml  Net 1339.01 ml    Intake/Output this shift: No intake/output data recorded.  Labs: No results for input(s): HGB in the last 72 hours. No results for input(s): WBC, RBC, HCT, PLT in the last 72 hours. No results for input(s): NA, K, CL, CO2, BUN, CREATININE, GLUCOSE, CALCIUM  in the last 72 hours. No results for input(s): LABPT, INR in the last 72 hours.  EXAM General - Patient is Alert, Appropriate, and Oriented Extremity - ABD soft Neurovascular intact Sensation intact distally Intact pulses distally Dorsiflexion/Plantar flexion intact Dressing - dressing C/D/I left hip Motor Function - intact, moving foot and toes well on exam.  Hemovac pulled without difficulty.  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   Weight as of this encounter: 100.7 kg. Advance diet Up with therapy D/C IV fluids Discharge home with home health  DVT Prophylaxis - Aspirin , Foot Pumps, and TED hose Weight Bearing As Tolerated left Leg Hemovac Pulled Begin Therapy Hip Preacutions  Vick Dunk, PA-C [facility] 08/12/2024, 8:10 AM

## 2024-08-12 NOTE — Evaluation (Signed)
 Occupational Therapy Evaluation Patient Details Name: Kevin Mccullough MRN: [id number] DOB: 06/26/1951 Today's Date: 08/12/2024   History of Present Illness   Pt is a 73 y.o. male s/p L total hip revision on 12/15. PMH significant for DM type II, R hip arthroplasty, arthritis, OA.     Clinical Impressions Pt admitted with above. Pt seen for OT evaluation this date, POD#1 from above surgery. Pt was independent in all ADLs prior to surgery, 1 fall in the past 6 months. Pt is eager to return to PLOF with less pain and improved safety and independence. Pt currently requires minimal assist for LB dressing while in seated position due to pain and limited AROM of L hip. Pt able to recall 2/3 posterior total hip precautions at start of session and unable to verbalize how to implement during ADL and mobility. Pt instructed in posterior total hip precautions and how to implement, self care skills, falls prevention strategies, home/routines modifications, DME/AE for LB bathing and dressing tasks, compression stocking mgt strategies, TTB transfer with handouts provided. Pt requires CCA-supervision for mobility t/f bathroom using RW for toileting needs, benefits from multimodal cues for step sequencing and safe AD use. At end of session, pt able to recall 3/3 posterior total hip precautions. Pt would benefit from additional instruction in self care skills and techniques to help maintain precautions with or without assistive devices to support recall and carryover prior to discharge. Recommend HH OT upon discharge.        If plan is discharge home, recommend the following:   A little help with walking and/or transfers;A little help with bathing/dressing/bathroom;Assist for transportation     Functional Status Assessment   Patient has had a recent decline in their functional status and demonstrates the ability to make significant improvements in function in a reasonable and predictable amount of time.      Equipment Recommendations   Tub/shower bench;Other (comment) (Hip kit (sock aide, reacher, LH sponge))      Precautions/Restrictions   Precautions Precautions: Posterior Hip;Fall Precaution Booklet Issued: Yes (comment) Recall of Precautions/Restrictions: Impaired Restrictions Weight Bearing Restrictions Per Provider Order: Yes LLE Weight Bearing Per Provider Order: Weight bearing as tolerated     Mobility Bed Mobility Overal bed mobility: Needs Assistance             General bed mobility comments: NT, upright in recliner pre and post session\    Transfers Overall transfer level: Needs assistance Equipment used: Rolling walker (2 wheels) Transfers: Sit to/from Stand Sit to Stand: Contact guard assist           General transfer comment: cues for weight shift, cues for hand placement      Balance Overall balance assessment: History of Falls, Needs assistance Sitting-balance support: Feet supported, No upper extremity supported Sitting balance-Leahy Scale: Good     Standing balance support: During functional activity, No upper extremity supported Standing balance-Leahy Scale: Fair Standing balance comment: able to don pants without UE support with fair balance                           ADL either performed or assessed with clinical judgement   ADL Overall ADL's : Needs assistance/impaired     Grooming: Wash/dry hands;Standing;Contact guard assist Grooming Details (indicate cue type and reason): CGA for safety at sink level         Upper Body Dressing : Standing;Supervision/safety   Lower Body Dressing: Minimal assistance;With adaptive equipment;Adhering  to hip precautions;Sit to/from stand Lower Body Dressing Details (indicate cue type and reason): MAX A to don TED hose, pt uses sock aide to don sock, min vcs for sequencing. pt able to utilize reacher to don jeans over bilat feet starting with surgical leg first. MIN A to thread over  feet (mostly due to stiff material on grip socks), pt able to stand from recliner with increased effort and pull pants over hips, CGA for standing balance without UE support on RW. Edu pt on sequencing and balance along with posterior hip precautions throughout. Pt with good understanding and carryover of post hip precautions Toilet Transfer: Contact guard assist;BSC/3in1;Rolling walker (2 wheels);Ambulation;Adhering to hip precautions Toilet Transfer Details (indicate cue type and reason): t/f bathroom with RW, min vcs for RW and step sequencing Toileting- Clothing Manipulation and Hygiene: Supervision/safety;Sit to/from stand Toileting - Clothing Manipulation Details (indicate cue type and reason): standing void, no physical assist Tub/ Engineer, Structural: Tub Building Surveyor Details (indicate cue type and reason): visual / verbal demo with video of TTB and utilization of LH sponge Functional mobility during ADLs: Supervision/safety;Rolling walker (2 wheels) General ADL Comments: pt amb t/f bathroom with CGA-supervision using RW, UB/LB dressing completed     Vision Baseline Vision/History: 1 Wears glasses Ability to See in Adequate Light: 0 Adequate Patient Visual Report: No change from baseline              Pertinent Vitals/Pain Pain Assessment Pain Assessment: 0-10 Pain Score: 1  Pain Location: L hip Pain Descriptors / Indicators: Aching, Grimacing, Guarding, Operative site guarding Pain Intervention(s): Limited activity within patient's tolerance, Monitored during session, Premedicated before session, Repositioned     Extremity/Trunk Assessment Upper Extremity Assessment Upper Extremity Assessment: Overall WFL for tasks assessed;Right hand dominant   Lower Extremity Assessment Lower Extremity Assessment: Defer to PT evaluation   Cervical / Trunk Assessment Cervical / Trunk Assessment: Normal   Communication Communication Communication: Impaired Factors Affecting  Communication: Hearing impaired   Cognition Arousal: Alert Behavior During Therapy: WFL for tasks assessed/performed, Anxious Cognition: No apparent impairments                               Following commands: Intact       Cueing  General Comments   Cueing Techniques: Verbal cues;Gestural cues;Tactile cues  VSS on RA           Home Living Family/patient expects to be discharged to:: Private residence Living Arrangements: Spouse/significant other Available Help at Discharge: Family;Available 24 hours/day Type of Home: House Home Access: Stairs to enter [employer] of Steps: 1 Entrance Stairs-Rails: Left Home Layout: One level     Bathroom Shower/Tub: Tub/shower unit with HH shower head    Bathroom Toilet: Handicapped height, riser with hand rails      Home Equipment: Agricultural Consultant (2 wheels);Toilet riser;Adaptive equipment Adaptive Equipment: Reacher;Sock aid        Prior Functioning/Environment Prior Level of Function : Independent/Modified Independent;History of Falls (last six months);Driving             Mobility Comments: independent, 1 fall in 6 months (getting off lawnmower) ADLs Comments: independent, driving    OT Problem List: Decreased strength;Decreased range of motion;Decreased activity tolerance;Impaired balance (sitting and/or standing);Decreased knowledge of use of DME or AE;Decreased knowledge of precautions;Pain   OT Treatment/Interventions: Self-care/ADL training;Therapeutic exercise;Energy conservation;DME and/or AE instruction;Therapeutic activities;Patient/family education;Balance training      OT Goals(Current goals  can be found in the care plan section)   Acute Rehab OT Goals OT Goal Formulation: With patient Time For Goal Achievement: 08/26/24 Potential to Achieve Goals: Good   OT Frequency:  Min 2X/week       AM-PAC OT 6 Clicks Daily Activity     Outcome Measure Help from another person eating  meals?: None Help from another person taking care of personal grooming?: None Help from another person toileting, which includes using toliet, bedpan, or urinal?: A Little Help from another person bathing (including washing, rinsing, drying)?: A Little Help from another person to put on and taking off regular upper body clothing?: None Help from another person to put on and taking off regular lower body clothing?: A Little 6 Click Score: 21   End of Session Equipment Utilized During Treatment: Rolling walker (2 wheels);Gait belt Nurse Communication: Mobility status;Precautions;Weight bearing status  Activity Tolerance: Patient tolerated treatment well Patient left: in chair;with call bell/phone within reach  OT Visit Diagnosis: Muscle weakness (generalized) (M62.81);Pain;Unsteadiness on feet (R26.81) Pain - Right/Left: Left Pain - part of body: Hip;Leg                Time: 9143-9059 OT Time Calculation (min): 44 min Charges:  OT General Charges $OT Visit: 1 Visit OT Evaluation $OT Eval Low Complexity: 1 Low OT Treatments $Self Care/Home Management : 23-37 mins  Aylla Huffine L. Vang Kraeger, OTR/L  08/12/2024, 9:57 AM

## 2024-08-12 NOTE — Evaluation (Signed)
 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 PMH includes DM, R THA, and multiple back surgeries.  Clinical Impression  Pt was pleasant and motivated to participate during the session and put forth good effort throughout. Pt required no physical assistance during the session and was steady during all standing activities with no buckling or overt LOB.  Pt was able to amb 2 x 125' and ascend/descend 4 steps x 2 with no adverse symptoms other than min/mod L hip apin that did not worsen with activity.  Pt demonstrated fair improving to good carryover of proper sequencing with below functional tasks for posterior hip precaution compliance.  Pt's SpO2 and HR were both WNL on room air measured at rest and after exertion.  Pt will benefit from continued PT services upon discharge to safely address deficits listed in patient problem list for decreased caregiver assistance and eventual return to PLOF.          If plan is discharge home, recommend the following: A little help with walking and/or transfers;A little help with bathing/dressing/bathroom;Assistance with cooking/housework;Assist for transportation;Help with stairs or ramp for entrance   Can travel by private vehicle        Equipment Recommendations None recommended by PT  Recommendations for Other Services       Functional Status Assessment Patient has had a recent decline in their functional status and demonstrates the ability to make significant improvements in function in a reasonable and predictable amount of time.     Precautions / Restrictions Precautions Precautions: Posterior Hip;Fall Precaution Booklet Issued: Yes (comment) Recall of Precautions/Restrictions:  Impaired Precaution/Restrictions Comments: Pt recalled 2/3 posterior hip precautions and the 3rd with minimal cuing Restrictions Weight Bearing Restrictions Per Provider Order: Yes LLE Weight Bearing Per Provider Order: Weight bearing as tolerated      Mobility  Bed Mobility               General bed mobility comments: NT, upright in recliner pre and post session    Transfers Overall transfer level: Needs assistance Equipment used: Rolling walker (2 wheels) Transfers: Sit to/from Stand Sit to Stand: Supervision           General transfer comment: Multi-modal cues for proper sequencing during sit to/from stand training for hip precaution compliance, fair to good carryover    Ambulation/Gait Ambulation/Gait assistance: Supervision, Contact guard assist Gait Distance (Feet): 125 Feet x 2 Assistive device: Rolling walker (2 wheels) Gait Pattern/deviations: Step-through pattern, Decreased step length - right, Antalgic, Decreased stance time - left Gait velocity: decreased     General Gait Details: Mildly antalgic gait pattern on the L but steady with no overt LOB or buckling  Stairs Stairs: Yes Stairs assistance: Contact guard assist Stair Management: Two rails, Step to pattern, Forwards Number of Stairs: 4 x 2 General stair comments: Visual demonstration followed by pt practice with pt ascending/descending 4 steps x 2 with fair carryover during the first attempt and good carryover during the second of proper sequencing; steady throughout with no LOB or buckling  Wheelchair Mobility     Tilt Bed    Modified Rankin (Stroke Patients Only)       Balance Overall balance assessment: History of Falls, Needs assistance   Sitting balance-Leahy Scale:  Good     Standing balance support: During functional activity, Bilateral upper extremity supported Standing balance-Leahy Scale: Good                               Pertinent Vitals/Pain Pain  Assessment Pain Assessment: 0-10 Pain Score: 4  Pain Location: L hip Pain Descriptors / Indicators: Sore Pain Intervention(s): Premedicated before session, Monitored during session, Ice applied, Repositioned    Home Living Family/patient expects to be discharged to:: Private residence Living Arrangements: Spouse/significant other Available Help at Discharge: Family;Available 24 hours/day Type of Home: House Home Access: Stairs to enter Entrance Stairs-Rails: Right;Left;Can reach both Entrance Stairs-Number of Steps: 5   Home Layout: One level Home Equipment: Agricultural Consultant (2 wheels);Toilet riser;Adaptive equipment;Cane - single point      Prior Function Prior Level of Function : Independent/Modified Independent;History of Falls (last six months);Driving             Mobility Comments: Ind Amb community distances without an AD, 1 fall while getting off the lawnmower ADLs Comments: independent, driving     Extremity/Trunk Assessment   Upper Extremity Assessment Upper Extremity Assessment: Right hand dominant;Overall WFL for tasks assessed    Lower Extremity Assessment Lower Extremity Assessment: Generalized weakness;LLE deficits/detail LLE: Unable to fully assess due to pain    Cervical / Trunk Assessment Cervical / Trunk Assessment: Normal  Communication   Communication Communication: Impaired Factors Affecting Communication: Hearing impaired    Cognition Arousal: Alert Behavior During Therapy: WFL for tasks assessed/performed   PT - Cognitive impairments: No apparent impairments                         Following commands: Intact       Cueing Cueing Techniques: Verbal cues, Visual cues, Tactile cues     General Comments General comments (skin integrity, edema, etc.): VSS on RA    Exercises Other Exercises Other Exercises: Car transfer sequencing education x 2 Other Exercises: Posterior hip precaution education verbally and during functional  task training Other Exercises: 90 deg L turn training x 6 to prevent CKC L hip IR   Assessment/Plan    PT Assessment Patient needs continued PT services  PT Problem List Decreased strength;Decreased activity tolerance;Decreased balance;Decreased mobility;Decreased knowledge of use of DME;Decreased knowledge of precautions;Pain       PT Treatment Interventions DME instruction;Gait training;Stair training;Functional mobility training;Therapeutic activities;Therapeutic exercise;Balance training;Patient/family education    PT Goals (Current goals can be found in the Care Plan section)  Acute Rehab PT Goals Patient Stated Goal: To get back to being independent PT Goal Formulation: With patient Time For Goal Achievement: 08/25/24 Potential to Achieve Goals: Good    Frequency BID     Co-evaluation               AM-PAC PT 6 Clicks Mobility  Outcome Measure Help needed turning from your back to your side while in a flat bed without using bedrails?: A Little Help needed moving from lying on your back to sitting on the side of a flat bed without using bedrails?: A Little Help needed moving to and from a bed to a chair (including a wheelchair)?: A Little Help needed standing up from a chair using your arms (e.g., wheelchair or bedside chair)?: A Little Help needed to walk in hospital room?: A Little Help needed climbing 3-5 steps with a railing? : A Little 6 Click  Score: 18    End of Session Equipment Utilized During Treatment: Gait belt Activity Tolerance: Patient tolerated treatment well Patient left: in chair;with call bell/phone within reach Nurse Communication: Mobility status;Weight bearing status PT Visit Diagnosis: Other abnormalities of gait and mobility (R26.89);Muscle weakness (generalized) (M62.81);Pain;History of falling (Z91.81) Pain - Right/Left: Left Pain - part of body: Hip    Time: 8960-8891 PT Time Calculation (min) (ACUTE ONLY): 29 min   Charges:   PT  Evaluation $PT Eval Moderate Complexity: 1 Mod PT Treatments $Gait Training: 8-22 mins $Therapeutic Activity: 8-22 mins PT General Charges $$ ACUTE PT VISIT: 1 Visit    D. Scott Zyrah Wiswell PT, DPT 08/12/2024, 11:29 AM

## 2024-08-12 NOTE — TOC Transition Note (Signed)
 Transition of Care [facility]) - Discharge Note   Patient Details  Name: Kevin Mccullough MRN: [id number] Date of Birth: November 22, 1950  Transition of Care [facility] CM/SW Contact:  Victory Jackquline RAMAN, RN Phone Number: 08/12/2024, 10:37 AM   Clinical Narrative:  Patient discharging home with HH/[facility] that was set up by his MD's office. Patient states that he has a RW and BSC at home from  previous surgery. Wife will be picking him up at the time of discharge.  No further concerns. RNCM signing off.    Final next level of care: Home w Home Health Services Barriers to Discharge: Barriers Resolved   Patient Goals and CMS Choice            Discharge Placement                Patient to be transferred to facility by: Spouse Name of family member notified: Rock Patient and family notified of of transfer: 08/12/24  Discharge Plan and Services Additional resources added to the After Visit Summary for                                       Social Drivers of Health (SDOH) Interventions SDOH Screenings   Food Insecurity: No Food Insecurity (08/11/2024)  Housing: Low Risk (08/11/2024)  Transportation Needs: No Transportation Needs (08/11/2024)  Utilities: Not At Risk (08/11/2024)  Financial Resource Strain: Low Risk  (08/05/2024)   Received from [facility]  Social Connections: Socially Integrated (08/11/2024)  Tobacco Use: Medium Risk (08/11/2024)     Readmission Risk Interventions     No data to display

## 2024-08-13 NOTE — Anesthesia Postprocedure Evaluation (Signed)
 Anesthesia Post Note  Patient: Kevin Mccullough  Procedure(s) Performed: TOTAL HIP REVISION (Left: Hip)  Patient location during evaluation: PACU Anesthesia Type: General Level of consciousness: awake and alert Pain management: pain level controlled Vital Signs Assessment: post-procedure vital signs reviewed and stable Respiratory status: spontaneous breathing, nonlabored ventilation, respiratory function stable and patient connected to nasal cannula oxygen Cardiovascular status: blood pressure returned to baseline and stable Postop Assessment: no apparent nausea or vomiting Anesthetic complications: no   No notable events documented.   Last Vitals:  Vitals:   08/12/24 0817 08/12/24 1310  BP: 105/67 116/69  Pulse: 66 69  Resp: 15 16  Temp: 36.7 C 36.6 C  SpO2: 97% 98%    Last Pain:  Vitals:   08/12/24 1310  TempSrc: Oral  PainSc: 0-No pain                 Debby [place]

## 2024-09-08 ENCOUNTER — Encounter: Payer: Self-pay | Admitting: Orthopedic Surgery
